# Patient Record
Sex: Female | Born: 1950 | Race: White | Hispanic: No | Marital: Married | Smoking: Never smoker
Health system: Southern US, Community
[De-identification: ages and names within clinical notes are randomized; demographics above are authoritative.]

## PROBLEM LIST (undated history)

## (undated) DIAGNOSIS — F411 Generalized anxiety disorder: Secondary | ICD-10-CM

## (undated) DIAGNOSIS — F419 Anxiety disorder, unspecified: Secondary | ICD-10-CM

## (undated) DIAGNOSIS — E785 Hyperlipidemia, unspecified: Secondary | ICD-10-CM

## (undated) DIAGNOSIS — J45909 Unspecified asthma, uncomplicated: Secondary | ICD-10-CM

## (undated) DIAGNOSIS — I10 Essential (primary) hypertension: Secondary | ICD-10-CM

## (undated) DIAGNOSIS — H409 Unspecified glaucoma: Secondary | ICD-10-CM

## (undated) DIAGNOSIS — T7840XA Allergy, unspecified, initial encounter: Secondary | ICD-10-CM

## (undated) DIAGNOSIS — R011 Cardiac murmur, unspecified: Secondary | ICD-10-CM

## (undated) DIAGNOSIS — I309 Acute pericarditis, unspecified: Secondary | ICD-10-CM

## (undated) DIAGNOSIS — I872 Venous insufficiency (chronic) (peripheral): Secondary | ICD-10-CM

## (undated) DIAGNOSIS — R079 Chest pain, unspecified: Secondary | ICD-10-CM

## (undated) DIAGNOSIS — N189 Chronic kidney disease, unspecified: Secondary | ICD-10-CM

## (undated) DIAGNOSIS — N951 Menopausal and female climacteric states: Secondary | ICD-10-CM

## (undated) DIAGNOSIS — G2581 Restless legs syndrome: Secondary | ICD-10-CM

## (undated) DIAGNOSIS — M1990 Unspecified osteoarthritis, unspecified site: Secondary | ICD-10-CM

## (undated) DIAGNOSIS — E876 Hypokalemia: Secondary | ICD-10-CM

## (undated) HISTORY — DX: Unspecified asthma, uncomplicated: J45.909

## (undated) HISTORY — DX: Restless legs syndrome: G25.81

## (undated) HISTORY — DX: Hypokalemia: E87.6

## (undated) HISTORY — DX: Unspecified osteoarthritis, unspecified site: M19.90

## (undated) HISTORY — DX: Essential (primary) hypertension: I10

## (undated) HISTORY — PX: CHEST TUBE INSERTION: SHX231

## (undated) HISTORY — DX: Cardiac murmur, unspecified: R01.1

## (undated) HISTORY — DX: Unspecified glaucoma: H40.9

## (undated) HISTORY — DX: Hyperlipidemia, unspecified: E78.5

## (undated) HISTORY — DX: Generalized anxiety disorder: F41.1

## (undated) HISTORY — DX: Venous insufficiency (chronic) (peripheral): I87.2

## (undated) HISTORY — DX: Anxiety disorder, unspecified: F41.9

## (undated) HISTORY — DX: Acute pericarditis, unspecified: I30.9

## (undated) HISTORY — DX: Chronic kidney disease, unspecified: N18.9

## (undated) HISTORY — DX: Chest pain, unspecified: R07.9

## (undated) HISTORY — PX: POLYPECTOMY: SHX149

## (undated) HISTORY — DX: Menopausal and female climacteric states: N95.1

## (undated) HISTORY — PX: VARICOSE VEIN SURGERY: SHX832

## (undated) HISTORY — DX: Allergy, unspecified, initial encounter: T78.40XA

---

## 1999-06-02 ENCOUNTER — Ambulatory Visit (HOSPITAL_BASED_OUTPATIENT_CLINIC_OR_DEPARTMENT_OTHER): Admission: RE | Admit: 1999-06-02 | Discharge: 1999-06-02 | Payer: Self-pay

## 2004-02-18 ENCOUNTER — Ambulatory Visit (HOSPITAL_COMMUNITY): Admission: RE | Admit: 2004-02-18 | Discharge: 2004-02-18 | Payer: Self-pay | Admitting: Internal Medicine

## 2004-02-18 ENCOUNTER — Ambulatory Visit: Payer: Self-pay | Admitting: Internal Medicine

## 2005-11-02 IMAGING — CT CT ANGIO CHEST
1 of 3 series · 18 of 31 positions shown · IV contrast (omnipaque)
Comparison: None.

CLINICAL DATA: Chest pain.  Evaluate for pulmonary embolus. 
 CHEST CT ANGIO WITH CONTRAST, 02/18/04:
TECHNIQUE: Multidetector CT imaging of the chest was performed according to the protocol for detection of pulmonary embolism during IV bolus injection of 150 cc Omnipaque 300.  Coronal and sagittal plane reformatted images were also generated.  
 There is no evidence for filling defect within the opacified pulmonary arterial vasculature to suggest the presence of an acute pulmonary embolus.  
 No lymphadenopathy is identified in the chest.  A tiny focus of soft tissue attenuation in the anterior mediastinum is of indeterminate significance as this patient is older than would be expected for persistent thymic remnant.  The patient is incidentally noted to have an anomalous pulmonary vein draining the posterior superior segment of the right lower lobe.  
 The heart is mildly enlarged and there is a small pericardial effusion.  
 Lung windows show a focal area of pleural thickening in the posterior right hemithorax (image 24).  Soft tissue attenuation at the apices is mildly asymmetric and probably related to scarring although neoplasm can have this appearance. 
 Incidental imaging of the upper abdomen demonstrates a focus of low attenuation at the gallbladder fossa.

[Series 4: chest/pe 1.0 b10f · axial · 0.57mm/px · z∈[-324,-92]mm · 18 of 512 slices shown]
[im 24/512  lung]
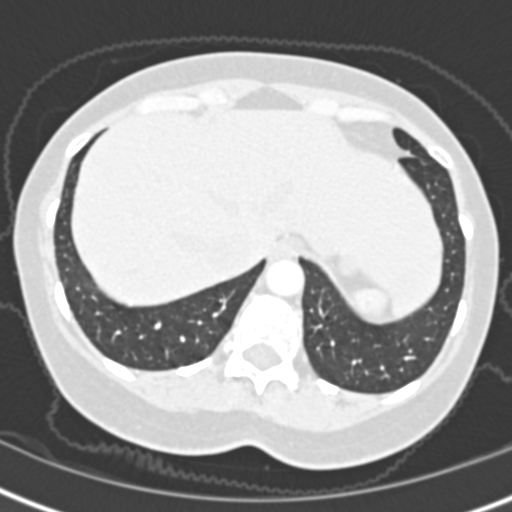
[im 47/512  mediastinal]
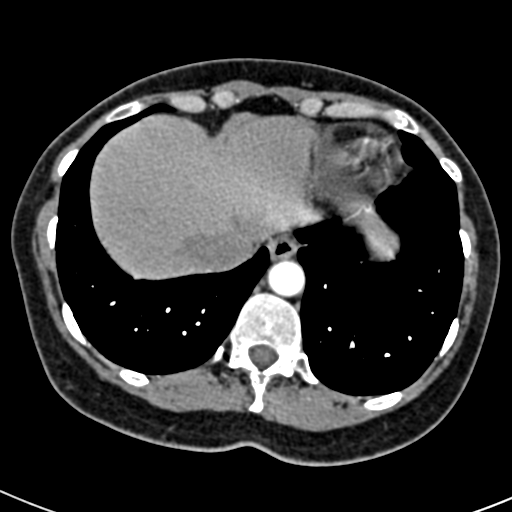
[im 93/512  lung]
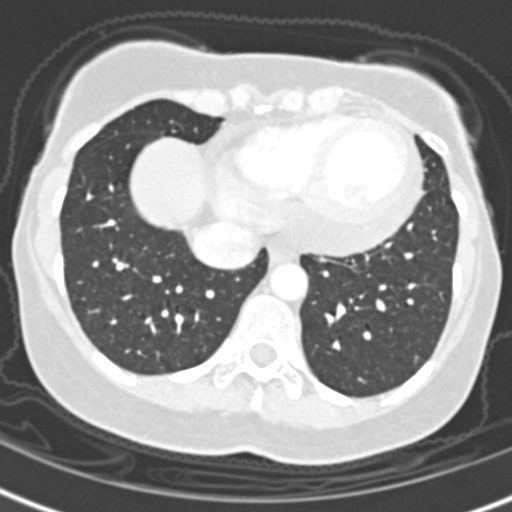
[im 117/512  mediastinal]
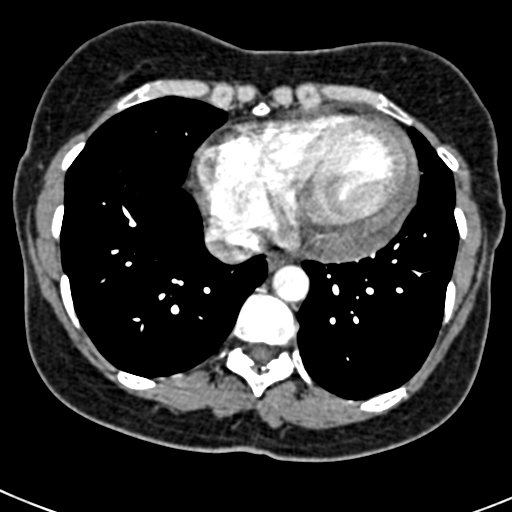
[im 140/512  lung]
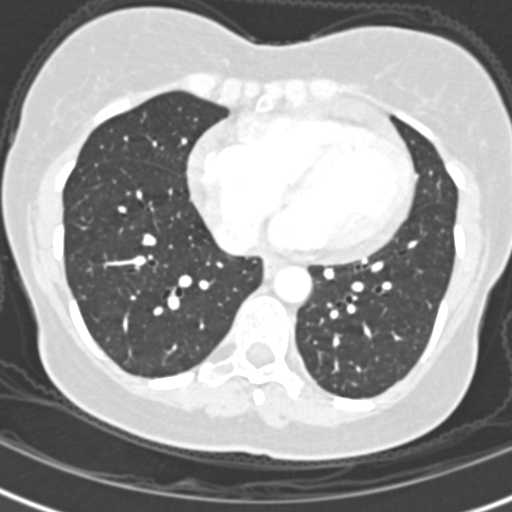
[im 171/512  mediastinal]
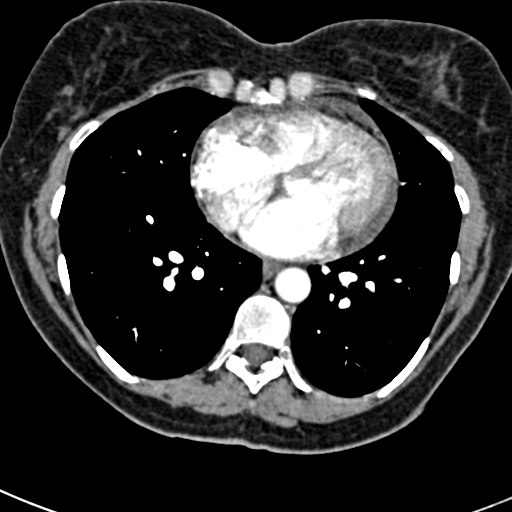
[im 186/512  lung]
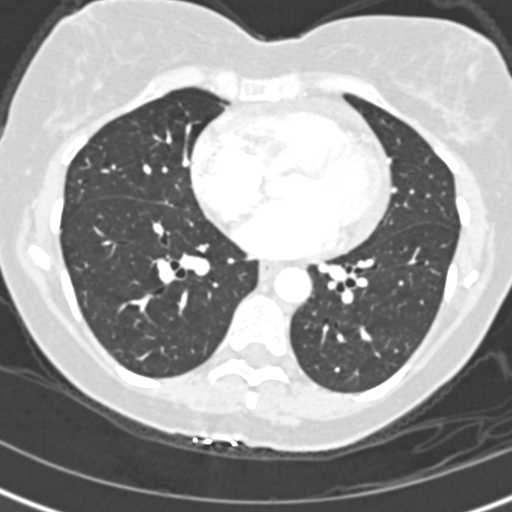
[im 210/512  mediastinal]
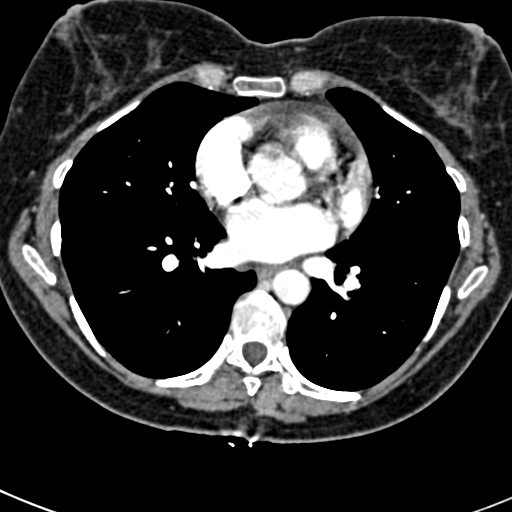
[im 233/512  lung]
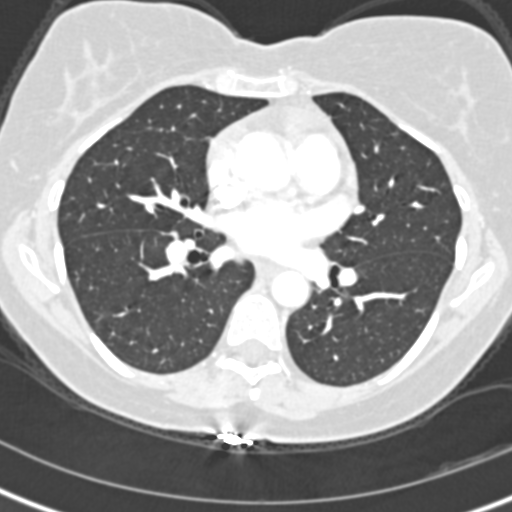
[im 279/512  mediastinal]
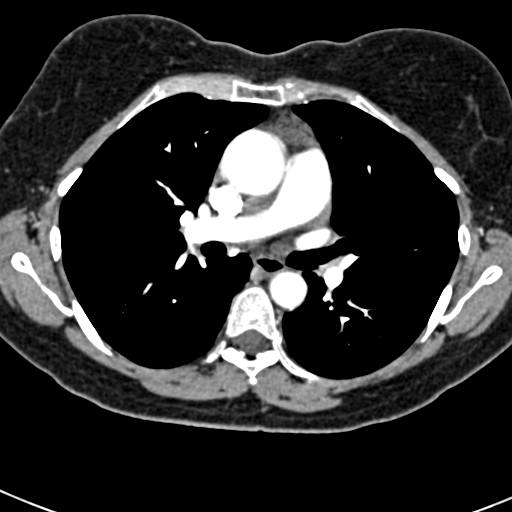
[im 302/512  lung]
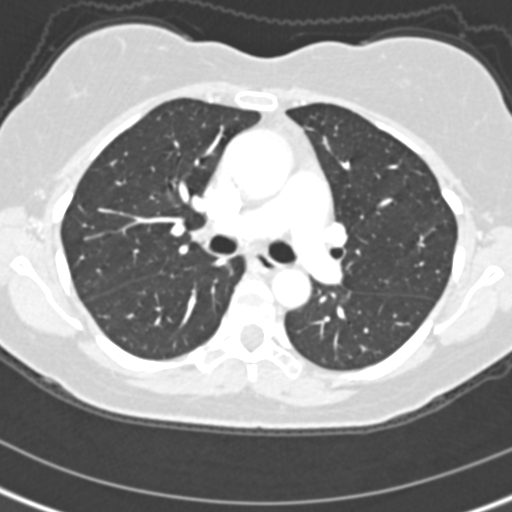
[im 326/512  mediastinal]
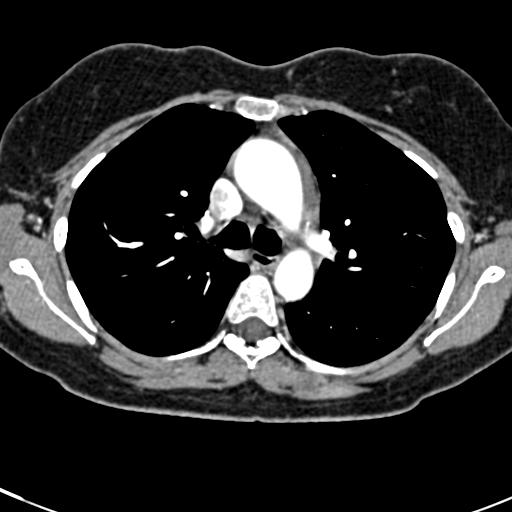
[im 341/512  lung]
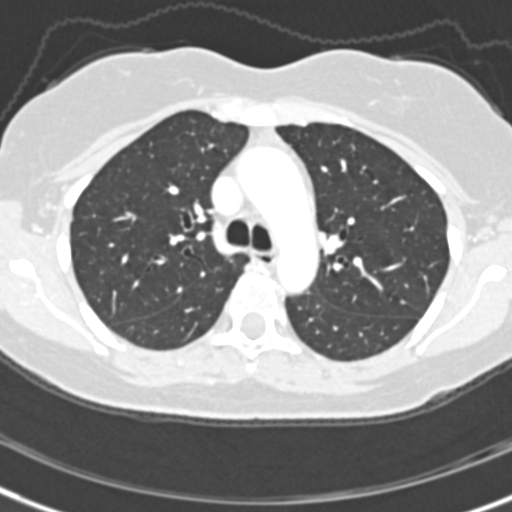
[im 372/512  mediastinal]
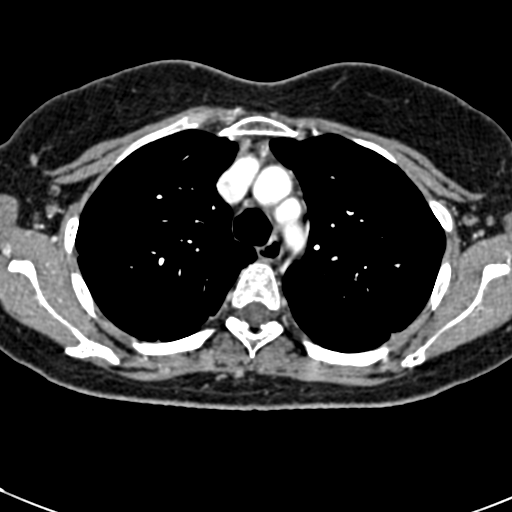
[im 395/512  lung]
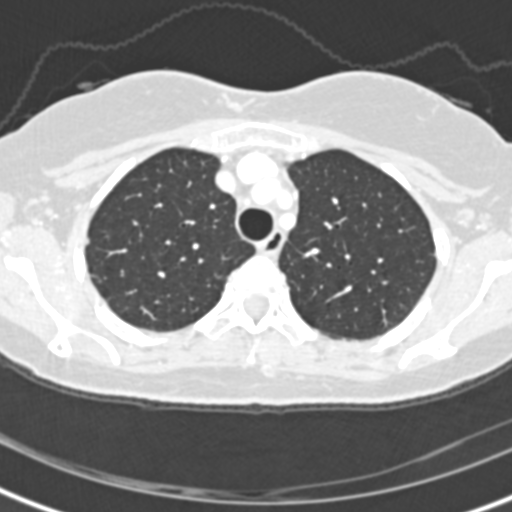
[im 419/512  mediastinal]
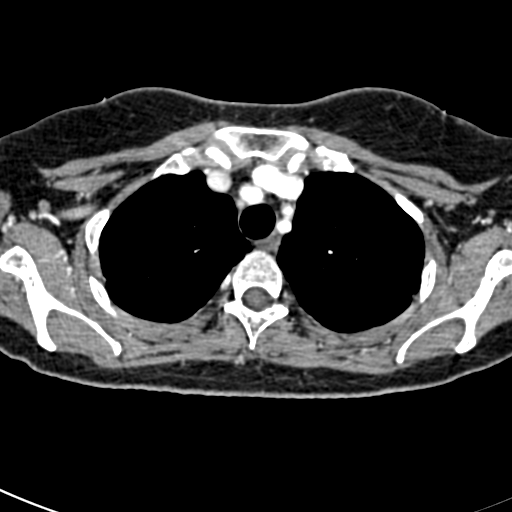
[im 465/512  lung]
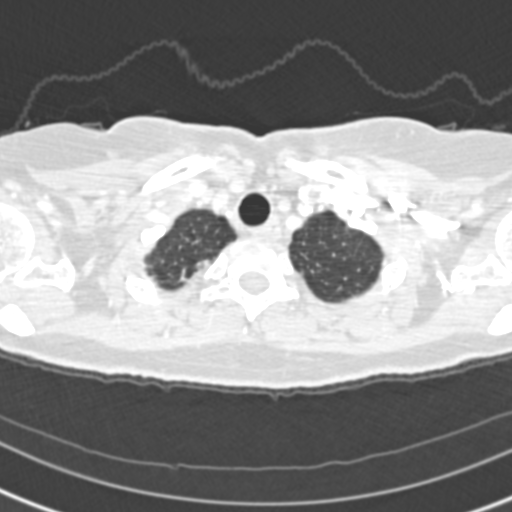
[im 488/512  mediastinal]
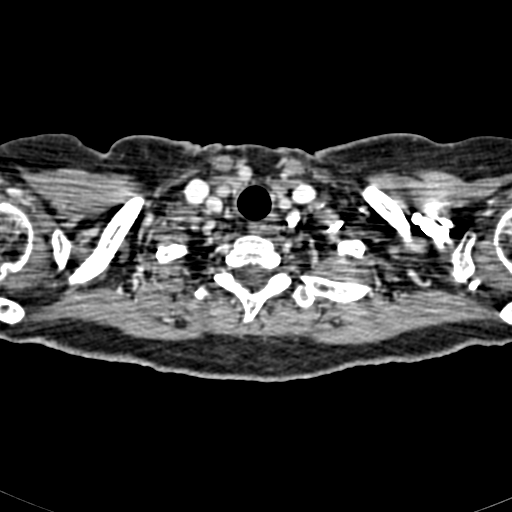

[18 of 31 positions shown; findings below may reference images not displayed]

IMPRESSION: 1. No spiral CT evidence for acute pulmonary embolus.  
 2. Asymmetric soft tissue density in the lung apices associated with a small focus of posteromedial pleural thickening on the right.  These features could be assessed for stability by a follow-up uninfused CT scan of the chest in 3 months.  The small focus of soft tissue attenuation in the anterior mediastinum could be reassessed at that time as well.  This follow-up CT scan could be performed without IV contrast.  
 3. Small pericardial effusion.
 4. Focus of low attenuation at the gallbladder fossa has been incompletely visualized.  This is probably related to focal fatty infiltration but dedicated liver CT or MRI would be helpful to further characterize.

## 2013-12-26 HISTORY — PX: COLONOSCOPY: SHX174

## 2015-11-07 DIAGNOSIS — M8580 Other specified disorders of bone density and structure, unspecified site: Secondary | ICD-10-CM

## 2015-11-07 DIAGNOSIS — N951 Menopausal and female climacteric states: Secondary | ICD-10-CM

## 2015-11-07 DIAGNOSIS — I10 Essential (primary) hypertension: Secondary | ICD-10-CM

## 2015-11-07 HISTORY — DX: Menopausal and female climacteric states: N95.1

## 2015-11-07 HISTORY — DX: Other specified disorders of bone density and structure, unspecified site: M85.80

## 2015-11-07 HISTORY — DX: Essential (primary) hypertension: I10

## 2018-10-23 DIAGNOSIS — S90862A Insect bite (nonvenomous), left foot, initial encounter: Secondary | ICD-10-CM | POA: Diagnosis not present

## 2018-10-23 DIAGNOSIS — L089 Local infection of the skin and subcutaneous tissue, unspecified: Secondary | ICD-10-CM | POA: Diagnosis not present

## 2018-10-23 DIAGNOSIS — W57XXXA Bitten or stung by nonvenomous insect and other nonvenomous arthropods, initial encounter: Secondary | ICD-10-CM | POA: Diagnosis not present

## 2018-10-23 DIAGNOSIS — E876 Hypokalemia: Secondary | ICD-10-CM | POA: Diagnosis not present

## 2018-11-14 DIAGNOSIS — I10 Essential (primary) hypertension: Secondary | ICD-10-CM | POA: Diagnosis not present

## 2018-11-14 DIAGNOSIS — G2581 Restless legs syndrome: Secondary | ICD-10-CM | POA: Diagnosis not present

## 2018-11-14 DIAGNOSIS — R7301 Impaired fasting glucose: Secondary | ICD-10-CM | POA: Diagnosis not present

## 2018-11-14 DIAGNOSIS — E785 Hyperlipidemia, unspecified: Secondary | ICD-10-CM | POA: Diagnosis not present

## 2018-11-14 DIAGNOSIS — E876 Hypokalemia: Secondary | ICD-10-CM | POA: Diagnosis not present

## 2018-11-14 DIAGNOSIS — F411 Generalized anxiety disorder: Secondary | ICD-10-CM | POA: Diagnosis not present

## 2018-11-14 DIAGNOSIS — M8589 Other specified disorders of bone density and structure, multiple sites: Secondary | ICD-10-CM | POA: Diagnosis not present

## 2018-11-20 DIAGNOSIS — R350 Frequency of micturition: Secondary | ICD-10-CM | POA: Diagnosis not present

## 2018-11-20 DIAGNOSIS — N9419 Other specified dyspareunia: Secondary | ICD-10-CM | POA: Diagnosis not present

## 2018-11-20 DIAGNOSIS — N951 Menopausal and female climacteric states: Secondary | ICD-10-CM | POA: Diagnosis not present

## 2018-11-20 DIAGNOSIS — R3915 Urgency of urination: Secondary | ICD-10-CM | POA: Diagnosis not present

## 2018-12-13 DIAGNOSIS — N952 Postmenopausal atrophic vaginitis: Secondary | ICD-10-CM | POA: Diagnosis not present

## 2019-01-04 DIAGNOSIS — D485 Neoplasm of uncertain behavior of skin: Secondary | ICD-10-CM | POA: Diagnosis not present

## 2019-01-04 DIAGNOSIS — L570 Actinic keratosis: Secondary | ICD-10-CM | POA: Diagnosis not present

## 2019-01-04 DIAGNOSIS — L578 Other skin changes due to chronic exposure to nonionizing radiation: Secondary | ICD-10-CM | POA: Diagnosis not present

## 2019-01-04 DIAGNOSIS — L918 Other hypertrophic disorders of the skin: Secondary | ICD-10-CM | POA: Diagnosis not present

## 2019-02-12 DIAGNOSIS — H2513 Age-related nuclear cataract, bilateral: Secondary | ICD-10-CM | POA: Diagnosis not present

## 2019-03-09 ENCOUNTER — Encounter: Payer: Self-pay | Admitting: Gastroenterology

## 2019-04-25 ENCOUNTER — Encounter: Payer: Self-pay | Admitting: Gastroenterology

## 2019-05-16 ENCOUNTER — Other Ambulatory Visit: Payer: Self-pay

## 2019-05-16 ENCOUNTER — Ambulatory Visit (AMBULATORY_SURGERY_CENTER): Payer: PPO

## 2019-05-16 VITALS — Temp 97.3°F | Ht 66.0 in | Wt 198.0 lb

## 2019-05-16 DIAGNOSIS — Z800 Family history of malignant neoplasm of digestive organs: Secondary | ICD-10-CM

## 2019-05-16 NOTE — Progress Notes (Signed)

## 2019-05-25 DIAGNOSIS — Z0000 Encounter for general adult medical examination without abnormal findings: Secondary | ICD-10-CM | POA: Diagnosis not present

## 2019-05-25 DIAGNOSIS — G2581 Restless legs syndrome: Secondary | ICD-10-CM | POA: Diagnosis not present

## 2019-05-25 DIAGNOSIS — Z9181 History of falling: Secondary | ICD-10-CM | POA: Diagnosis not present

## 2019-05-25 DIAGNOSIS — F411 Generalized anxiety disorder: Secondary | ICD-10-CM | POA: Diagnosis not present

## 2019-05-25 DIAGNOSIS — R7301 Impaired fasting glucose: Secondary | ICD-10-CM | POA: Diagnosis not present

## 2019-05-25 DIAGNOSIS — Z139 Encounter for screening, unspecified: Secondary | ICD-10-CM | POA: Diagnosis not present

## 2019-05-25 DIAGNOSIS — M8589 Other specified disorders of bone density and structure, multiple sites: Secondary | ICD-10-CM | POA: Diagnosis not present

## 2019-05-25 DIAGNOSIS — E876 Hypokalemia: Secondary | ICD-10-CM | POA: Diagnosis not present

## 2019-05-25 DIAGNOSIS — E785 Hyperlipidemia, unspecified: Secondary | ICD-10-CM | POA: Diagnosis not present

## 2019-05-25 DIAGNOSIS — I10 Essential (primary) hypertension: Secondary | ICD-10-CM | POA: Diagnosis not present

## 2019-05-25 DIAGNOSIS — Z6831 Body mass index (BMI) 31.0-31.9, adult: Secondary | ICD-10-CM | POA: Diagnosis not present

## 2019-05-25 DIAGNOSIS — Z1331 Encounter for screening for depression: Secondary | ICD-10-CM | POA: Diagnosis not present

## 2019-05-28 ENCOUNTER — Encounter: Payer: Self-pay | Admitting: Gastroenterology

## 2019-06-01 ENCOUNTER — Encounter: Payer: Self-pay | Admitting: Gastroenterology

## 2019-06-01 ENCOUNTER — Other Ambulatory Visit: Payer: Self-pay

## 2019-06-01 ENCOUNTER — Ambulatory Visit (AMBULATORY_SURGERY_CENTER): Payer: PPO | Admitting: Gastroenterology

## 2019-06-01 VITALS — BP 107/60 | HR 60 | Temp 97.5°F | Resp 12 | Ht 66.0 in | Wt 198.0 lb

## 2019-06-01 DIAGNOSIS — Z800 Family history of malignant neoplasm of digestive organs: Secondary | ICD-10-CM | POA: Diagnosis not present

## 2019-06-01 DIAGNOSIS — Z1211 Encounter for screening for malignant neoplasm of colon: Secondary | ICD-10-CM

## 2019-06-01 DIAGNOSIS — G2581 Restless legs syndrome: Secondary | ICD-10-CM | POA: Diagnosis not present

## 2019-06-01 DIAGNOSIS — I10 Essential (primary) hypertension: Secondary | ICD-10-CM | POA: Diagnosis not present

## 2019-06-01 MED ORDER — SODIUM CHLORIDE 0.9 % IV SOLN: 500.0000 mL | Freq: Once | INTRAVENOUS | Status: DC

## 2019-06-01 NOTE — Progress Notes (Signed)
Temp by JB. VS by DT. 

## 2019-06-01 NOTE — Progress Notes (Signed)
Report to PACU, RN, vss, BBS= Clear.  

## 2019-06-01 NOTE — Patient Instructions (Signed)
YOU HAD AN ENDOSCOPIC PROCEDURE TODAY AT THE [facility]:   Refer to the procedure report that was given to you for any specific questions about what was found during the examination.  If the procedure report does not answer your questions, please call your gastroenterologist to clarify.  If you requested that your care partner not be given the details of your procedure findings, then the procedure report has been included in a sealed envelope for you to review at your convenience later.  YOU SHOULD EXPECT: Some feelings of bloating in the abdomen. Passage of more gas than usual.  Walking can help get rid of the air that was put into your GI tract during the procedure and reduce the bloating. If you had a lower endoscopy (such as a colonoscopy or flexible sigmoidoscopy) you may notice spotting of blood in your stool or on the toilet paper. If you underwent a bowel prep for your procedure, you may not have a normal bowel movement for a few days.  Please Note:  You might notice some irritation and congestion in your nose or some drainage.  This is from the oxygen used during your procedure.  There is no need for concern and it should clear up in a day or so.  SYMPTOMS TO REPORT IMMEDIATELY:   Following lower endoscopy (colonoscopy or flexible sigmoidoscopy):  Excessive amounts of blood in the stool  Significant tenderness or worsening of abdominal pains  Swelling of the abdomen that is new, acute  Fever of 100F or higher  For urgent or emergent issues, a gastroenterologist can be reached at any hour by calling [phone]. Do not use MyChart messaging for urgent concerns.    DIET:  We do recommend a small meal at first, but then you may proceed to your regular diet.  Drink plenty of fluids but you should avoid alcoholic beverages for 24 hours.  ACTIVITY:  You should plan to take it easy for the rest of today and you should NOT DRIVE or use heavy machinery until tomorrow (because  of the sedation medicines used during the test).    FOLLOW UP: Our staff will call the number listed on your records 48-72 hours following your procedure to check on you and address any questions or concerns that you may have regarding the information given to you following your procedure. If we do not reach you, we will leave a message.  We will attempt to reach you two times.  During this call, we will ask if you have developed any symptoms of COVID 19. If you develop any symptoms (ie: fever, flu-like symptoms, shortness of breath, cough etc.) before then, please call [phone].  If you test positive for Covid 19 in the 2 weeks post procedure, please call and report this information to us.    If any biopsies were taken you will be contacted by phone or by letter within the next 1-3 weeks.  Please call us at [phone] if you have not heard about the biopsies in 3 weeks.    SIGNATURES/CONFIDENTIALITY: You and/or your care partner have signed paperwork which will be entered into your electronic medical record.  These signatures attest to the fact that that the information above on your After Visit Summary has been reviewed and is understood.  Full responsibility of the confidentiality of this discharge information lies with you and/or your care-partner. 

## 2019-06-01 NOTE — Op Note (Signed)
[facility] Patient Name: Susan Finley Procedure Date: 06/01/2019 8:41 AM MRN: [id number] Endoscopist: Jackquline Denmark , MD Age: 69 Referring MD:  Date of Birth: [birth year] Gender: Female Account #: [id number] Procedure:                Colonoscopy Indications:              Screening in patient at increased risk: Family                            history of 1st-degree relative with colorectal                            cancer (mom) Medicines:                Monitored Anesthesia Care Procedure:                Pre-Anesthesia Assessment:                           - Prior to the procedure, a History and Physical                            was performed, and patient medications and                            allergies were reviewed. The patient's tolerance of                            previous anesthesia was also reviewed. The risks                            and benefits of the procedure and the sedation                            options and risks were discussed with the patient.                            All questions were answered, and informed consent                            was obtained. Prior Anticoagulants: The patient has                            taken no previous anticoagulant or antiplatelet                            agents. ASA Grade Assessment: II - A patient with                            mild systemic disease. After reviewing the risks                            and benefits, the patient was deemed in  satisfactory condition to undergo the procedure.                           After obtaining informed consent, the colonoscope                            was passed under direct vision. Throughout the                            procedure, the patient's blood pressure, pulse, and                            oxygen saturations were monitored continuously. The                            Colonoscope was introduced through the anus and                       advanced to the 2 cm into the ileum. The                            colonoscopy was performed without difficulty. The                            patient tolerated the procedure well. The quality                            of the bowel preparation was adequate to identify                            polyps. The ileocecal valve, appendiceal orifice,                            and rectum were photographed. Scope In: 8:49:13 AM Scope Out: 9:03:52 AM Scope Withdrawal Time: 0 hours 10 minutes 5 seconds  Total Procedure Duration: 0 hours 14 minutes 39 seconds  Findings:                 A few small-mouthed diverticula were found in the                            sigmoid colon.                           Non-bleeding internal hemorrhoids were found during                            retroflexion. The hemorrhoids were small.                           The terminal ileum appeared normal.                           The exam was otherwise without abnormality on  direct and retroflexion views. Complications:            No immediate complications. Estimated Blood Loss:     Estimated blood loss: none. Impression:               -Mild sigmoid diverticulosis                           -Non-bleeding internal hemorrhoids.                           -Otherwise normal colonoscopy to TI. Recommendation:           - Patient has a contact number available for                            emergencies. The signs and symptoms of potential                            delayed complications were discussed with the                            patient. Return to normal activities tomorrow.                            Written discharge instructions were provided to the                            patient.                           - Resume previous diet.                           - Continue present medications.                           - Repeat colonoscopy in 5 years for screening                             purposes. Earlier, if with any new problems or if                            there is any change in family history.                           - Return to GI clinic PRN.                           - D/W Doreene Nest, MD 06/01/2019 9:09:34 AM This report has been signed electronically.

## 2019-06-05 ENCOUNTER — Telehealth: Payer: Self-pay

## 2019-06-05 ENCOUNTER — Telehealth: Payer: Self-pay | Admitting: *Deleted

## 2019-06-05 NOTE — Telephone Encounter (Signed)
No answer for post procedure call back. Left message for patient to call with questions or concerns. 

## 2019-06-05 NOTE — Telephone Encounter (Signed)
No answer, left message to call back later today, B.Bilal Manzer RN. 

## 2019-10-03 DIAGNOSIS — L821 Other seborrheic keratosis: Secondary | ICD-10-CM | POA: Diagnosis not present

## 2019-10-03 DIAGNOSIS — L570 Actinic keratosis: Secondary | ICD-10-CM | POA: Diagnosis not present

## 2019-10-03 DIAGNOSIS — L820 Inflamed seborrheic keratosis: Secondary | ICD-10-CM | POA: Diagnosis not present

## 2019-10-17 DIAGNOSIS — Z780 Asymptomatic menopausal state: Secondary | ICD-10-CM | POA: Diagnosis not present

## 2019-10-17 DIAGNOSIS — M85852 Other specified disorders of bone density and structure, left thigh: Secondary | ICD-10-CM | POA: Diagnosis not present

## 2019-10-17 DIAGNOSIS — M85851 Other specified disorders of bone density and structure, right thigh: Secondary | ICD-10-CM | POA: Diagnosis not present

## 2019-10-17 DIAGNOSIS — Z1231 Encounter for screening mammogram for malignant neoplasm of breast: Secondary | ICD-10-CM | POA: Diagnosis not present

## 2019-11-15 DIAGNOSIS — L578 Other skin changes due to chronic exposure to nonionizing radiation: Secondary | ICD-10-CM | POA: Diagnosis not present

## 2019-11-15 DIAGNOSIS — L570 Actinic keratosis: Secondary | ICD-10-CM | POA: Diagnosis not present

## 2019-11-15 DIAGNOSIS — D485 Neoplasm of uncertain behavior of skin: Secondary | ICD-10-CM | POA: Diagnosis not present

## 2019-11-15 DIAGNOSIS — L820 Inflamed seborrheic keratosis: Secondary | ICD-10-CM | POA: Diagnosis not present

## 2019-11-15 DIAGNOSIS — D225 Melanocytic nevi of trunk: Secondary | ICD-10-CM | POA: Diagnosis not present

## 2019-11-15 DIAGNOSIS — L821 Other seborrheic keratosis: Secondary | ICD-10-CM | POA: Diagnosis not present

## 2019-11-15 DIAGNOSIS — L814 Other melanin hyperpigmentation: Secondary | ICD-10-CM | POA: Diagnosis not present

## 2019-11-15 DIAGNOSIS — D2271 Melanocytic nevi of right lower limb, including hip: Secondary | ICD-10-CM | POA: Diagnosis not present

## 2019-11-19 DIAGNOSIS — R6889 Other general symptoms and signs: Secondary | ICD-10-CM | POA: Diagnosis not present

## 2019-11-22 DIAGNOSIS — I10 Essential (primary) hypertension: Secondary | ICD-10-CM | POA: Diagnosis not present

## 2019-11-22 DIAGNOSIS — R7301 Impaired fasting glucose: Secondary | ICD-10-CM | POA: Diagnosis not present

## 2019-11-22 DIAGNOSIS — E876 Hypokalemia: Secondary | ICD-10-CM | POA: Diagnosis not present

## 2019-11-22 DIAGNOSIS — G2581 Restless legs syndrome: Secondary | ICD-10-CM | POA: Diagnosis not present

## 2019-11-22 DIAGNOSIS — F411 Generalized anxiety disorder: Secondary | ICD-10-CM | POA: Diagnosis not present

## 2019-11-22 DIAGNOSIS — M8589 Other specified disorders of bone density and structure, multiple sites: Secondary | ICD-10-CM | POA: Diagnosis not present

## 2019-11-22 DIAGNOSIS — E785 Hyperlipidemia, unspecified: Secondary | ICD-10-CM | POA: Diagnosis not present

## 2020-01-02 DIAGNOSIS — I8312 Varicose veins of left lower extremity with inflammation: Secondary | ICD-10-CM | POA: Diagnosis not present

## 2020-01-02 DIAGNOSIS — G2581 Restless legs syndrome: Secondary | ICD-10-CM | POA: Diagnosis not present

## 2020-01-02 DIAGNOSIS — I8311 Varicose veins of right lower extremity with inflammation: Secondary | ICD-10-CM | POA: Diagnosis not present

## 2020-01-02 DIAGNOSIS — I83813 Varicose veins of bilateral lower extremities with pain: Secondary | ICD-10-CM | POA: Diagnosis not present

## 2020-01-24 DIAGNOSIS — I83813 Varicose veins of bilateral lower extremities with pain: Secondary | ICD-10-CM | POA: Diagnosis not present

## 2020-01-30 DIAGNOSIS — I8312 Varicose veins of left lower extremity with inflammation: Secondary | ICD-10-CM | POA: Diagnosis not present

## 2020-01-30 DIAGNOSIS — I8311 Varicose veins of right lower extremity with inflammation: Secondary | ICD-10-CM | POA: Diagnosis not present

## 2020-01-30 DIAGNOSIS — G2581 Restless legs syndrome: Secondary | ICD-10-CM | POA: Diagnosis not present

## 2020-01-30 DIAGNOSIS — I83813 Varicose veins of bilateral lower extremities with pain: Secondary | ICD-10-CM | POA: Diagnosis not present

## 2020-03-26 DIAGNOSIS — Z01419 Encounter for gynecological examination (general) (routine) without abnormal findings: Secondary | ICD-10-CM | POA: Diagnosis not present

## 2020-04-17 DIAGNOSIS — M7652 Patellar tendinitis, left knee: Secondary | ICD-10-CM | POA: Diagnosis not present

## 2020-04-17 DIAGNOSIS — M7651 Patellar tendinitis, right knee: Secondary | ICD-10-CM | POA: Diagnosis not present

## 2020-04-17 DIAGNOSIS — M170 Bilateral primary osteoarthritis of knee: Secondary | ICD-10-CM | POA: Diagnosis not present

## 2020-04-25 DIAGNOSIS — I83811 Varicose veins of right lower extremities with pain: Secondary | ICD-10-CM | POA: Diagnosis not present

## 2020-04-25 DIAGNOSIS — I8311 Varicose veins of right lower extremity with inflammation: Secondary | ICD-10-CM | POA: Diagnosis not present

## 2020-04-30 DIAGNOSIS — I8311 Varicose veins of right lower extremity with inflammation: Secondary | ICD-10-CM | POA: Diagnosis not present

## 2020-05-07 DIAGNOSIS — I8311 Varicose veins of right lower extremity with inflammation: Secondary | ICD-10-CM | POA: Diagnosis not present

## 2020-05-23 DIAGNOSIS — F411 Generalized anxiety disorder: Secondary | ICD-10-CM | POA: Diagnosis not present

## 2020-05-23 DIAGNOSIS — I10 Essential (primary) hypertension: Secondary | ICD-10-CM | POA: Diagnosis not present

## 2020-05-23 DIAGNOSIS — I8312 Varicose veins of left lower extremity with inflammation: Secondary | ICD-10-CM | POA: Diagnosis not present

## 2020-05-23 DIAGNOSIS — G2581 Restless legs syndrome: Secondary | ICD-10-CM | POA: Diagnosis not present

## 2020-05-23 DIAGNOSIS — E785 Hyperlipidemia, unspecified: Secondary | ICD-10-CM | POA: Diagnosis not present

## 2020-05-23 DIAGNOSIS — R7301 Impaired fasting glucose: Secondary | ICD-10-CM | POA: Diagnosis not present

## 2020-05-23 DIAGNOSIS — E876 Hypokalemia: Secondary | ICD-10-CM | POA: Diagnosis not present

## 2020-05-23 DIAGNOSIS — M8589 Other specified disorders of bone density and structure, multiple sites: Secondary | ICD-10-CM | POA: Diagnosis not present

## 2020-06-04 DIAGNOSIS — I8311 Varicose veins of right lower extremity with inflammation: Secondary | ICD-10-CM | POA: Diagnosis not present

## 2020-06-18 DIAGNOSIS — I83892 Varicose veins of left lower extremities with other complications: Secondary | ICD-10-CM | POA: Diagnosis not present

## 2020-06-18 DIAGNOSIS — I8312 Varicose veins of left lower extremity with inflammation: Secondary | ICD-10-CM | POA: Diagnosis not present

## 2020-07-09 DIAGNOSIS — I8311 Varicose veins of right lower extremity with inflammation: Secondary | ICD-10-CM | POA: Diagnosis not present

## 2020-07-09 DIAGNOSIS — M7981 Nontraumatic hematoma of soft tissue: Secondary | ICD-10-CM | POA: Diagnosis not present

## 2020-07-10 DIAGNOSIS — L299 Pruritus, unspecified: Secondary | ICD-10-CM | POA: Diagnosis not present

## 2020-07-10 DIAGNOSIS — L301 Dyshidrosis [pompholyx]: Secondary | ICD-10-CM | POA: Diagnosis not present

## 2020-07-30 DIAGNOSIS — M7981 Nontraumatic hematoma of soft tissue: Secondary | ICD-10-CM | POA: Diagnosis not present

## 2020-07-30 DIAGNOSIS — I8312 Varicose veins of left lower extremity with inflammation: Secondary | ICD-10-CM | POA: Diagnosis not present

## 2020-08-21 DIAGNOSIS — I83891 Varicose veins of right lower extremities with other complications: Secondary | ICD-10-CM | POA: Diagnosis not present

## 2020-08-21 DIAGNOSIS — I8311 Varicose veins of right lower extremity with inflammation: Secondary | ICD-10-CM | POA: Diagnosis not present

## 2020-09-11 DIAGNOSIS — I8312 Varicose veins of left lower extremity with inflammation: Secondary | ICD-10-CM | POA: Diagnosis not present

## 2020-09-18 DIAGNOSIS — M79672 Pain in left foot: Secondary | ICD-10-CM | POA: Diagnosis not present

## 2020-09-18 DIAGNOSIS — S99922A Unspecified injury of left foot, initial encounter: Secondary | ICD-10-CM | POA: Diagnosis not present

## 2020-09-30 DIAGNOSIS — M7989 Other specified soft tissue disorders: Secondary | ICD-10-CM | POA: Diagnosis not present

## 2020-09-30 DIAGNOSIS — M19072 Primary osteoarthritis, left ankle and foot: Secondary | ICD-10-CM | POA: Diagnosis not present

## 2020-09-30 DIAGNOSIS — M79672 Pain in left foot: Secondary | ICD-10-CM | POA: Diagnosis not present

## 2020-10-02 DIAGNOSIS — E785 Hyperlipidemia, unspecified: Secondary | ICD-10-CM | POA: Diagnosis not present

## 2020-10-02 DIAGNOSIS — Z139 Encounter for screening, unspecified: Secondary | ICD-10-CM | POA: Diagnosis not present

## 2020-10-02 DIAGNOSIS — Z1331 Encounter for screening for depression: Secondary | ICD-10-CM | POA: Diagnosis not present

## 2020-10-02 DIAGNOSIS — Z9181 History of falling: Secondary | ICD-10-CM | POA: Diagnosis not present

## 2020-10-02 DIAGNOSIS — E669 Obesity, unspecified: Secondary | ICD-10-CM | POA: Diagnosis not present

## 2020-10-02 DIAGNOSIS — Z0000 Encounter for general adult medical examination without abnormal findings: Secondary | ICD-10-CM | POA: Diagnosis not present

## 2020-10-10 DIAGNOSIS — M7981 Nontraumatic hematoma of soft tissue: Secondary | ICD-10-CM | POA: Diagnosis not present

## 2020-10-10 DIAGNOSIS — I8312 Varicose veins of left lower extremity with inflammation: Secondary | ICD-10-CM | POA: Diagnosis not present

## 2020-10-22 DIAGNOSIS — Z1231 Encounter for screening mammogram for malignant neoplasm of breast: Secondary | ICD-10-CM | POA: Diagnosis not present

## 2020-11-14 DIAGNOSIS — I8312 Varicose veins of left lower extremity with inflammation: Secondary | ICD-10-CM | POA: Diagnosis not present

## 2020-11-14 DIAGNOSIS — I83812 Varicose veins of left lower extremities with pain: Secondary | ICD-10-CM | POA: Diagnosis not present

## 2020-11-14 DIAGNOSIS — I83892 Varicose veins of left lower extremities with other complications: Secondary | ICD-10-CM | POA: Diagnosis not present

## 2020-11-21 DIAGNOSIS — M8589 Other specified disorders of bone density and structure, multiple sites: Secondary | ICD-10-CM | POA: Diagnosis not present

## 2020-11-21 DIAGNOSIS — G2581 Restless legs syndrome: Secondary | ICD-10-CM | POA: Diagnosis not present

## 2020-11-21 DIAGNOSIS — E785 Hyperlipidemia, unspecified: Secondary | ICD-10-CM | POA: Diagnosis not present

## 2020-11-21 DIAGNOSIS — E876 Hypokalemia: Secondary | ICD-10-CM | POA: Diagnosis not present

## 2020-11-21 DIAGNOSIS — R7301 Impaired fasting glucose: Secondary | ICD-10-CM | POA: Diagnosis not present

## 2020-11-21 DIAGNOSIS — F411 Generalized anxiety disorder: Secondary | ICD-10-CM | POA: Diagnosis not present

## 2020-11-21 DIAGNOSIS — I10 Essential (primary) hypertension: Secondary | ICD-10-CM | POA: Diagnosis not present

## 2020-12-17 DIAGNOSIS — H2513 Age-related nuclear cataract, bilateral: Secondary | ICD-10-CM | POA: Diagnosis not present

## 2020-12-22 DIAGNOSIS — J302 Other seasonal allergic rhinitis: Secondary | ICD-10-CM | POA: Diagnosis not present

## 2020-12-22 DIAGNOSIS — F419 Anxiety disorder, unspecified: Secondary | ICD-10-CM | POA: Diagnosis not present

## 2020-12-22 DIAGNOSIS — I493 Ventricular premature depolarization: Secondary | ICD-10-CM | POA: Diagnosis not present

## 2020-12-22 DIAGNOSIS — I249 Acute ischemic heart disease, unspecified: Secondary | ICD-10-CM | POA: Diagnosis not present

## 2020-12-22 DIAGNOSIS — I313 Pericardial effusion (noninflammatory): Secondary | ICD-10-CM | POA: Diagnosis not present

## 2020-12-22 DIAGNOSIS — E7800 Pure hypercholesterolemia, unspecified: Secondary | ICD-10-CM | POA: Diagnosis not present

## 2020-12-22 DIAGNOSIS — E876 Hypokalemia: Secondary | ICD-10-CM | POA: Diagnosis not present

## 2020-12-22 DIAGNOSIS — R531 Weakness: Secondary | ICD-10-CM | POA: Diagnosis not present

## 2020-12-22 DIAGNOSIS — I160 Hypertensive urgency: Secondary | ICD-10-CM | POA: Diagnosis not present

## 2020-12-22 DIAGNOSIS — I10 Essential (primary) hypertension: Secondary | ICD-10-CM | POA: Diagnosis not present

## 2020-12-22 DIAGNOSIS — M1990 Unspecified osteoarthritis, unspecified site: Secondary | ICD-10-CM | POA: Diagnosis not present

## 2020-12-23 DIAGNOSIS — I517 Cardiomegaly: Secondary | ICD-10-CM | POA: Diagnosis not present

## 2020-12-23 DIAGNOSIS — I313 Pericardial effusion (noninflammatory): Secondary | ICD-10-CM | POA: Diagnosis not present

## 2020-12-23 DIAGNOSIS — J9811 Atelectasis: Secondary | ICD-10-CM | POA: Diagnosis not present

## 2020-12-23 DIAGNOSIS — I10 Essential (primary) hypertension: Secondary | ICD-10-CM | POA: Diagnosis not present

## 2020-12-23 DIAGNOSIS — R531 Weakness: Secondary | ICD-10-CM | POA: Diagnosis not present

## 2020-12-23 DIAGNOSIS — R0602 Shortness of breath: Secondary | ICD-10-CM | POA: Diagnosis not present

## 2020-12-23 DIAGNOSIS — I493 Ventricular premature depolarization: Secondary | ICD-10-CM | POA: Diagnosis not present

## 2020-12-23 DIAGNOSIS — I160 Hypertensive urgency: Secondary | ICD-10-CM | POA: Diagnosis not present

## 2020-12-23 DIAGNOSIS — M1990 Unspecified osteoarthritis, unspecified site: Secondary | ICD-10-CM | POA: Diagnosis not present

## 2020-12-23 DIAGNOSIS — F419 Anxiety disorder, unspecified: Secondary | ICD-10-CM | POA: Diagnosis not present

## 2020-12-23 DIAGNOSIS — R5383 Other fatigue: Secondary | ICD-10-CM | POA: Diagnosis not present

## 2020-12-23 DIAGNOSIS — I249 Acute ischemic heart disease, unspecified: Secondary | ICD-10-CM | POA: Diagnosis not present

## 2020-12-23 DIAGNOSIS — R079 Chest pain, unspecified: Secondary | ICD-10-CM | POA: Diagnosis not present

## 2020-12-23 DIAGNOSIS — J302 Other seasonal allergic rhinitis: Secondary | ICD-10-CM | POA: Diagnosis not present

## 2020-12-23 DIAGNOSIS — E876 Hypokalemia: Secondary | ICD-10-CM | POA: Diagnosis not present

## 2020-12-23 DIAGNOSIS — E7800 Pure hypercholesterolemia, unspecified: Secondary | ICD-10-CM | POA: Diagnosis not present

## 2020-12-23 DIAGNOSIS — K449 Diaphragmatic hernia without obstruction or gangrene: Secondary | ICD-10-CM | POA: Diagnosis not present

## 2020-12-26 ENCOUNTER — Other Ambulatory Visit: Payer: Self-pay | Admitting: Cardiology

## 2020-12-27 DIAGNOSIS — Z0100 Encounter for examination of eyes and vision without abnormal findings: Secondary | ICD-10-CM | POA: Diagnosis not present

## 2020-12-31 DIAGNOSIS — I10 Essential (primary) hypertension: Secondary | ICD-10-CM | POA: Diagnosis not present

## 2020-12-31 DIAGNOSIS — I309 Acute pericarditis, unspecified: Secondary | ICD-10-CM | POA: Diagnosis not present

## 2020-12-31 DIAGNOSIS — E876 Hypokalemia: Secondary | ICD-10-CM | POA: Diagnosis not present

## 2020-12-31 DIAGNOSIS — R079 Chest pain, unspecified: Secondary | ICD-10-CM | POA: Diagnosis not present

## 2020-12-31 DIAGNOSIS — E785 Hyperlipidemia, unspecified: Secondary | ICD-10-CM | POA: Diagnosis not present

## 2020-12-31 DIAGNOSIS — M25561 Pain in right knee: Secondary | ICD-10-CM | POA: Diagnosis not present

## 2021-01-06 ENCOUNTER — Ambulatory Visit: Payer: Medicare HMO | Admitting: Cardiology

## 2021-01-06 ENCOUNTER — Other Ambulatory Visit: Payer: Self-pay

## 2021-01-06 VITALS — BP 140/80 | HR 61 | Ht 66.6 in | Wt 204.2 lb

## 2021-01-06 DIAGNOSIS — E782 Mixed hyperlipidemia: Secondary | ICD-10-CM

## 2021-01-06 DIAGNOSIS — I309 Acute pericarditis, unspecified: Secondary | ICD-10-CM | POA: Insufficient documentation

## 2021-01-06 DIAGNOSIS — I10 Essential (primary) hypertension: Secondary | ICD-10-CM | POA: Insufficient documentation

## 2021-01-06 DIAGNOSIS — M1990 Unspecified osteoarthritis, unspecified site: Secondary | ICD-10-CM | POA: Insufficient documentation

## 2021-01-06 DIAGNOSIS — E876 Hypokalemia: Secondary | ICD-10-CM | POA: Insufficient documentation

## 2021-01-06 DIAGNOSIS — I872 Venous insufficiency (chronic) (peripheral): Secondary | ICD-10-CM | POA: Insufficient documentation

## 2021-01-06 DIAGNOSIS — J45909 Unspecified asthma, uncomplicated: Secondary | ICD-10-CM | POA: Insufficient documentation

## 2021-01-06 DIAGNOSIS — F411 Generalized anxiety disorder: Secondary | ICD-10-CM | POA: Insufficient documentation

## 2021-01-06 DIAGNOSIS — H409 Unspecified glaucoma: Secondary | ICD-10-CM | POA: Insufficient documentation

## 2021-01-06 DIAGNOSIS — E785 Hyperlipidemia, unspecified: Secondary | ICD-10-CM | POA: Insufficient documentation

## 2021-01-06 DIAGNOSIS — T7840XA Allergy, unspecified, initial encounter: Secondary | ICD-10-CM | POA: Insufficient documentation

## 2021-01-06 DIAGNOSIS — R011 Cardiac murmur, unspecified: Secondary | ICD-10-CM | POA: Insufficient documentation

## 2021-01-06 DIAGNOSIS — R079 Chest pain, unspecified: Secondary | ICD-10-CM | POA: Insufficient documentation

## 2021-01-06 DIAGNOSIS — F419 Anxiety disorder, unspecified: Secondary | ICD-10-CM | POA: Insufficient documentation

## 2021-01-06 DIAGNOSIS — G2581 Restless legs syndrome: Secondary | ICD-10-CM | POA: Insufficient documentation

## 2021-01-06 DIAGNOSIS — N951 Menopausal and female climacteric states: Secondary | ICD-10-CM | POA: Insufficient documentation

## 2021-01-06 DIAGNOSIS — N189 Chronic kidney disease, unspecified: Secondary | ICD-10-CM | POA: Insufficient documentation

## 2021-01-06 DIAGNOSIS — E669 Obesity, unspecified: Secondary | ICD-10-CM | POA: Diagnosis not present

## 2021-01-06 NOTE — Progress Notes (Signed)
Cardiology Office Note:    Date:  01/06/2021   ID:  Susan Finley, DOB 11/24/1950, MRN [id number]  PCP:  Susan Dress, MD  Cardiologist:  Susan Lindau, MD   Referring MD: Susan Dress, MD    ASSESSMENT:    1. Essential hypertension   2. Mixed hyperlipidemia   3. Obesity (BMI 30.0-34.9)    PLAN:    In order of problems listed above:  Primary prevention stressed with the patient.  Importance of compliance with diet medication stressed and he vocalized understanding.  I advised the patient to at least half an hour a day 5 days a week and she promises to do so. Essential hypertension: Blood pressure stable and diet was emphasized.  Lifestyle modification urged.  Salt intake issues were discussed. Dyslipidemia: Lipids reviewed from [facility] and found to be fine and I discussed this finding with her.  She is happy about it. Obesity: Weight reduction was stressed diet was emphasized.  Risks of obesity explained and she promises to do better. Hospitalization records were reviewed with her and questions were answered to her satisfaction.Patient will be seen in follow-up appointment in 9 months or earlier if the patient has any concerns    Medication Adjustments/Labs and Tests Ordered: Current medicines are reviewed at length with the patient today.  Concerns regarding medicines are outlined above.  Orders Placed This Encounter  Procedures   EKG 12-Lead    No orders of the defined types were placed in this encounter.    History of Present Illness:    Susan Finley is a 70 y.o. female who is being seen today for the evaluation of dyspnea on exertion at the request of Susan Dress, MD. patient is a pleasant 70 year old female.  She has past medical history of essential hypertension and dyslipidemia and obesity.  She leads a sedentary lifestyle.  She went to the emergency room with chest pain.  She was admitted.  Echocardiogram stress test was unremarkable.   She is here for follow-up.  She is a little worried about her family history of coronary artery disease.  At the time of my evaluation, the patient is alert awake oriented and in no distress.  I reviewed records from the hospital at length.  Past Medical History:  Diagnosis Date   Allergy    Anxiety    Arthritis    Asthma    Chest pain    Chronic kidney disease    Essential hypertension 11/07/2015   Generalized anxiety disorder    Glaucoma    Heart murmur    Hyperlipidemia    Hypertension    Hypokalemia    Menopausal state    Osteopenia 11/07/2015   Pericardial effusion, acute    RLS (restless legs syndrome)    Symptomatic menopausal or female climacteric states 11/07/2015   Venous insufficiency of both lower extremities     Past Surgical History:  Procedure Laterality Date   CHEST TUBE INSERTION     15 yrs ago   COLONOSCOPY  12/26/2013   POLYPECTOMY     VARICOSE VEIN SURGERY      Current Medications: Current Meds  Medication Sig   aspirin 325 MG tablet Take 325 mg by mouth daily.   atorvastatin (LIPITOR) 10 MG tablet Take 10 mg by mouth at bedtime.   Calcium Carbonate (CALCIUM 500 PO) Take 1 tablet by mouth 2 (two) times daily.   cetirizine (ZYRTEC) 10 MG tablet Take 10 mg by mouth daily  as needed for allergies.   chlorthalidone (HYGROTON) 25 MG tablet Take 25 mg by mouth daily.   fluticasone (FLONASE) 50 MCG/ACT nasal spray Place 2 sprays into both nostrils daily.   melatonin 5 MG TABS Take 5 mg by mouth at bedtime.   metoprolol succinate (TOPROL-XL) 25 MG 24 hr tablet Take 25 mg by mouth daily.   Multiple Vitamin (MULTIVITAMIN PO) Take 1 tablet by mouth daily.   nitroGLYCERIN (NITROSTAT) 0.4 MG SL tablet Place 0.4 mg under the tongue every 5 (five) minutes as needed for chest pain.   Omega-3 Fatty Acids (FISH OIL PO) Take 1,000 mg by mouth daily.   ondansetron (ZOFRAN) 4 MG tablet Take 4 mg by mouth 4 (four) times daily as needed for nausea/vomiting.   PARoxetine  (PAXIL) 20 MG tablet Take 20 mg by mouth daily.   potassium chloride (KLOR-CON) 10 MEQ tablet Take 10 mEq by mouth daily.   rOPINIRole (REQUIP) 1 MG tablet Take 1 tablet by mouth at bedtime   rOPINIRole (REQUIP) 2 MG tablet Take 2 mg by mouth at bedtime. At 8:00     Allergies:   Sulfamethoxazole   Social History   Socioeconomic History   Marital status: Married    Spouse name: Not on file   Number of children: Not on file   Years of education: Not on file   Highest education level: Not on file  Occupational History   Not on file  Tobacco Use   Smoking status: Never   Smokeless tobacco: Never  Vaping Use   Vaping Use: Never used  Substance and Sexual Activity   Alcohol use: Not Currently   Drug use: Never   Sexual activity: Not on file  Other Topics Concern   Not on file  Social History Narrative   Not on file   Social Determinants of Health   Financial Resource Strain: Not on file  Food Insecurity: Not on file  Transportation Needs: Not on file  Physical Activity: Not on file  Stress: Not on file  Social Connections: Not on file     Family History: The patient's family history includes Atrial fibrillation in her mother; Brain cancer in her maternal uncle; Colon cancer (age of onset: 63) in her mother; Congestive Heart Failure in her mother; Diabetes in her mother and paternal uncle; Heart attack in her father; Hypertension in her father and mother. There is no history of Colon polyps, Esophageal cancer, Rectal cancer, or Stomach cancer.  ROS:   Please see the history of present illness.    All other systems reviewed and are negative.  EKGs/Labs/Other Studies Reviewed:    The following studies were reviewed today: I reviewed echocardiogram stress test and EKG report with her at length.  EKG reveals sinus rhythm and nonspecific ST-T changes   Recent Labs: No results found for requested labs within last 8760 hours.  Recent Lipid Panel No results found for: CHOL,  TRIG, HDL, CHOLHDL, VLDL, LDLCALC, LDLDIRECT  Physical Exam:    VS:  BP 140/80   Pulse 61   Ht 5' 6.6" (1.692 m)   Wt 204 lb 3.2 oz (92.6 kg)   SpO2 96%   BMI 32.37 kg/m     Wt Readings from Last 3 Encounters:  01/06/21 204 lb 3.2 oz (92.6 kg)  06/01/19 198 lb (89.8 kg)  05/16/19 198 lb (89.8 kg)     GEN: Patient is in no acute distress HEENT: Normal NECK: No JVD; No carotid bruits LYMPHATICS: No lymphadenopathy  CARDIAC: S1 S2 regular, 2/6 systolic murmur at the apex. RESPIRATORY:  Clear to auscultation without rales, wheezing or rhonchi  ABDOMEN: Soft, non-tender, non-distended MUSCULOSKELETAL:  No edema; No deformity  SKIN: Warm and dry NEUROLOGIC:  Alert and oriented x 3 PSYCHIATRIC:  Normal affect    Signed, Susan Lindau, MD  01/06/2021 3:20 PM    [facility]

## 2021-01-06 NOTE — Patient Instructions (Signed)
Medication Instructions:  No medication changes *If you need a refill on your cardiac medications before your next appointment, please call your pharmacy*   Lab Work: None ordered If you have labs (blood work) drawn today and your tests are completely normal, you will receive your results only by: . MyChart Message (if you have MyChart) OR . A paper copy in the mail If you have any lab test that is abnormal or we need to change your treatment, we will call you to review the results.   Testing/Procedures: None ordered   Follow-Up: At [facility], you and your health needs are our priority.  As part of our continuing mission to provide you with exceptional heart care, we have created designated Provider Care Teams.  These Care Teams include your primary Cardiologist (physician) and Advanced Practice Providers (APPs -  Physician Assistants and Nurse Practitioners) who all work together to provide you with the care you need, when you need it.  We recommend signing up for the patient portal called "MyChart".  Sign up information is provided on this After Visit Summary.  MyChart is used to connect with patients for Virtual Visits (Telemedicine).  Patients are able to view lab/test results, encounter notes, upcoming appointments, etc.  Non-urgent messages can be sent to your provider as well.   To learn more about what you can do with MyChart, go to [email or web address].    Your next appointment:   9 month(s)  The format for your next appointment:   In Person  Provider:   Rajan Revankar, MD   Other Instructions NA 

## 2021-01-08 DIAGNOSIS — E876 Hypokalemia: Secondary | ICD-10-CM | POA: Diagnosis not present

## 2021-01-29 DIAGNOSIS — M25561 Pain in right knee: Secondary | ICD-10-CM | POA: Diagnosis not present

## 2021-01-29 DIAGNOSIS — M1711 Unilateral primary osteoarthritis, right knee: Secondary | ICD-10-CM | POA: Diagnosis not present

## 2021-02-09 DIAGNOSIS — M25561 Pain in right knee: Secondary | ICD-10-CM | POA: Diagnosis not present

## 2021-02-11 DIAGNOSIS — B9689 Other specified bacterial agents as the cause of diseases classified elsewhere: Secondary | ICD-10-CM | POA: Diagnosis not present

## 2021-02-11 DIAGNOSIS — J019 Acute sinusitis, unspecified: Secondary | ICD-10-CM | POA: Diagnosis not present

## 2021-02-25 DIAGNOSIS — M9903 Segmental and somatic dysfunction of lumbar region: Secondary | ICD-10-CM | POA: Diagnosis not present

## 2021-02-25 DIAGNOSIS — M7061 Trochanteric bursitis, right hip: Secondary | ICD-10-CM | POA: Diagnosis not present

## 2021-02-25 DIAGNOSIS — M5136 Other intervertebral disc degeneration, lumbar region: Secondary | ICD-10-CM | POA: Diagnosis not present

## 2021-02-25 DIAGNOSIS — M9902 Segmental and somatic dysfunction of thoracic region: Secondary | ICD-10-CM | POA: Diagnosis not present

## 2021-02-25 DIAGNOSIS — M9905 Segmental and somatic dysfunction of pelvic region: Secondary | ICD-10-CM | POA: Diagnosis not present

## 2021-02-25 DIAGNOSIS — M4126 Other idiopathic scoliosis, lumbar region: Secondary | ICD-10-CM | POA: Diagnosis not present

## 2021-02-26 DIAGNOSIS — M1711 Unilateral primary osteoarthritis, right knee: Secondary | ICD-10-CM | POA: Diagnosis not present

## 2021-02-26 DIAGNOSIS — S83281A Other tear of lateral meniscus, current injury, right knee, initial encounter: Secondary | ICD-10-CM | POA: Diagnosis not present

## 2021-03-03 DIAGNOSIS — M5136 Other intervertebral disc degeneration, lumbar region: Secondary | ICD-10-CM | POA: Diagnosis not present

## 2021-03-03 DIAGNOSIS — M9902 Segmental and somatic dysfunction of thoracic region: Secondary | ICD-10-CM | POA: Diagnosis not present

## 2021-03-03 DIAGNOSIS — M9905 Segmental and somatic dysfunction of pelvic region: Secondary | ICD-10-CM | POA: Diagnosis not present

## 2021-03-03 DIAGNOSIS — M7061 Trochanteric bursitis, right hip: Secondary | ICD-10-CM | POA: Diagnosis not present

## 2021-03-03 DIAGNOSIS — M4126 Other idiopathic scoliosis, lumbar region: Secondary | ICD-10-CM | POA: Diagnosis not present

## 2021-03-03 DIAGNOSIS — M9903 Segmental and somatic dysfunction of lumbar region: Secondary | ICD-10-CM | POA: Diagnosis not present

## 2021-03-11 DIAGNOSIS — M7061 Trochanteric bursitis, right hip: Secondary | ICD-10-CM | POA: Diagnosis not present

## 2021-03-11 DIAGNOSIS — M9905 Segmental and somatic dysfunction of pelvic region: Secondary | ICD-10-CM | POA: Diagnosis not present

## 2021-03-11 DIAGNOSIS — M4126 Other idiopathic scoliosis, lumbar region: Secondary | ICD-10-CM | POA: Diagnosis not present

## 2021-03-11 DIAGNOSIS — M9903 Segmental and somatic dysfunction of lumbar region: Secondary | ICD-10-CM | POA: Diagnosis not present

## 2021-03-11 DIAGNOSIS — M5136 Other intervertebral disc degeneration, lumbar region: Secondary | ICD-10-CM | POA: Diagnosis not present

## 2021-03-11 DIAGNOSIS — M9902 Segmental and somatic dysfunction of thoracic region: Secondary | ICD-10-CM | POA: Diagnosis not present

## 2021-03-18 DIAGNOSIS — M4126 Other idiopathic scoliosis, lumbar region: Secondary | ICD-10-CM | POA: Diagnosis not present

## 2021-03-18 DIAGNOSIS — M7061 Trochanteric bursitis, right hip: Secondary | ICD-10-CM | POA: Diagnosis not present

## 2021-03-18 DIAGNOSIS — M9902 Segmental and somatic dysfunction of thoracic region: Secondary | ICD-10-CM | POA: Diagnosis not present

## 2021-03-18 DIAGNOSIS — M9903 Segmental and somatic dysfunction of lumbar region: Secondary | ICD-10-CM | POA: Diagnosis not present

## 2021-03-18 DIAGNOSIS — M9905 Segmental and somatic dysfunction of pelvic region: Secondary | ICD-10-CM | POA: Diagnosis not present

## 2021-03-18 DIAGNOSIS — M5136 Other intervertebral disc degeneration, lumbar region: Secondary | ICD-10-CM | POA: Diagnosis not present

## 2021-03-25 DIAGNOSIS — M4126 Other idiopathic scoliosis, lumbar region: Secondary | ICD-10-CM | POA: Diagnosis not present

## 2021-03-25 DIAGNOSIS — M9902 Segmental and somatic dysfunction of thoracic region: Secondary | ICD-10-CM | POA: Diagnosis not present

## 2021-03-25 DIAGNOSIS — M5136 Other intervertebral disc degeneration, lumbar region: Secondary | ICD-10-CM | POA: Diagnosis not present

## 2021-03-25 DIAGNOSIS — M9905 Segmental and somatic dysfunction of pelvic region: Secondary | ICD-10-CM | POA: Diagnosis not present

## 2021-03-25 DIAGNOSIS — M7061 Trochanteric bursitis, right hip: Secondary | ICD-10-CM | POA: Diagnosis not present

## 2021-03-25 DIAGNOSIS — M9903 Segmental and somatic dysfunction of lumbar region: Secondary | ICD-10-CM | POA: Diagnosis not present

## 2021-04-16 DIAGNOSIS — M9905 Segmental and somatic dysfunction of pelvic region: Secondary | ICD-10-CM | POA: Diagnosis not present

## 2021-04-16 DIAGNOSIS — M7061 Trochanteric bursitis, right hip: Secondary | ICD-10-CM | POA: Diagnosis not present

## 2021-04-16 DIAGNOSIS — M9903 Segmental and somatic dysfunction of lumbar region: Secondary | ICD-10-CM | POA: Diagnosis not present

## 2021-04-16 DIAGNOSIS — M4126 Other idiopathic scoliosis, lumbar region: Secondary | ICD-10-CM | POA: Diagnosis not present

## 2021-04-16 DIAGNOSIS — M5136 Other intervertebral disc degeneration, lumbar region: Secondary | ICD-10-CM | POA: Diagnosis not present

## 2021-04-16 DIAGNOSIS — M9902 Segmental and somatic dysfunction of thoracic region: Secondary | ICD-10-CM | POA: Diagnosis not present

## 2021-04-24 DIAGNOSIS — M5136 Other intervertebral disc degeneration, lumbar region: Secondary | ICD-10-CM | POA: Diagnosis not present

## 2021-04-24 DIAGNOSIS — M9903 Segmental and somatic dysfunction of lumbar region: Secondary | ICD-10-CM | POA: Diagnosis not present

## 2021-04-24 DIAGNOSIS — M4126 Other idiopathic scoliosis, lumbar region: Secondary | ICD-10-CM | POA: Diagnosis not present

## 2021-04-24 DIAGNOSIS — M9905 Segmental and somatic dysfunction of pelvic region: Secondary | ICD-10-CM | POA: Diagnosis not present

## 2021-04-24 DIAGNOSIS — M7061 Trochanteric bursitis, right hip: Secondary | ICD-10-CM | POA: Diagnosis not present

## 2021-04-24 DIAGNOSIS — M9902 Segmental and somatic dysfunction of thoracic region: Secondary | ICD-10-CM | POA: Diagnosis not present

## 2021-05-01 DIAGNOSIS — M9902 Segmental and somatic dysfunction of thoracic region: Secondary | ICD-10-CM | POA: Diagnosis not present

## 2021-05-01 DIAGNOSIS — M9905 Segmental and somatic dysfunction of pelvic region: Secondary | ICD-10-CM | POA: Diagnosis not present

## 2021-05-01 DIAGNOSIS — M7061 Trochanteric bursitis, right hip: Secondary | ICD-10-CM | POA: Diagnosis not present

## 2021-05-01 DIAGNOSIS — M5136 Other intervertebral disc degeneration, lumbar region: Secondary | ICD-10-CM | POA: Diagnosis not present

## 2021-05-01 DIAGNOSIS — M4126 Other idiopathic scoliosis, lumbar region: Secondary | ICD-10-CM | POA: Diagnosis not present

## 2021-05-01 DIAGNOSIS — M9903 Segmental and somatic dysfunction of lumbar region: Secondary | ICD-10-CM | POA: Diagnosis not present

## 2021-05-21 DIAGNOSIS — S83281A Other tear of lateral meniscus, current injury, right knee, initial encounter: Secondary | ICD-10-CM | POA: Diagnosis not present

## 2021-05-21 DIAGNOSIS — M1711 Unilateral primary osteoarthritis, right knee: Secondary | ICD-10-CM | POA: Diagnosis not present

## 2021-05-22 DIAGNOSIS — M19042 Primary osteoarthritis, left hand: Secondary | ICD-10-CM | POA: Diagnosis not present

## 2021-05-22 DIAGNOSIS — E876 Hypokalemia: Secondary | ICD-10-CM | POA: Diagnosis not present

## 2021-05-22 DIAGNOSIS — R7301 Impaired fasting glucose: Secondary | ICD-10-CM | POA: Diagnosis not present

## 2021-05-22 DIAGNOSIS — I10 Essential (primary) hypertension: Secondary | ICD-10-CM | POA: Diagnosis not present

## 2021-05-22 DIAGNOSIS — M8589 Other specified disorders of bone density and structure, multiple sites: Secondary | ICD-10-CM | POA: Diagnosis not present

## 2021-05-22 DIAGNOSIS — F411 Generalized anxiety disorder: Secondary | ICD-10-CM | POA: Diagnosis not present

## 2021-05-22 DIAGNOSIS — E785 Hyperlipidemia, unspecified: Secondary | ICD-10-CM | POA: Diagnosis not present

## 2021-05-22 DIAGNOSIS — G2581 Restless legs syndrome: Secondary | ICD-10-CM | POA: Diagnosis not present

## 2021-05-25 DIAGNOSIS — M79671 Pain in right foot: Secondary | ICD-10-CM | POA: Diagnosis not present

## 2021-05-29 DIAGNOSIS — Z01419 Encounter for gynecological examination (general) (routine) without abnormal findings: Secondary | ICD-10-CM | POA: Diagnosis not present

## 2021-08-20 DIAGNOSIS — L820 Inflamed seborrheic keratosis: Secondary | ICD-10-CM | POA: Diagnosis not present

## 2021-08-20 DIAGNOSIS — M25561 Pain in right knee: Secondary | ICD-10-CM | POA: Diagnosis not present

## 2021-08-20 DIAGNOSIS — D225 Melanocytic nevi of trunk: Secondary | ICD-10-CM | POA: Diagnosis not present

## 2021-08-20 DIAGNOSIS — M23241 Derangement of anterior horn of lateral meniscus due to old tear or injury, right knee: Secondary | ICD-10-CM | POA: Diagnosis not present

## 2021-08-27 ENCOUNTER — Other Ambulatory Visit: Payer: Self-pay | Admitting: Internal Medicine

## 2021-08-27 DIAGNOSIS — M8589 Other specified disorders of bone density and structure, multiple sites: Secondary | ICD-10-CM

## 2021-08-27 DIAGNOSIS — Z1231 Encounter for screening mammogram for malignant neoplasm of breast: Secondary | ICD-10-CM

## 2021-09-09 DIAGNOSIS — J019 Acute sinusitis, unspecified: Secondary | ICD-10-CM | POA: Diagnosis not present

## 2021-11-06 DIAGNOSIS — Z1231 Encounter for screening mammogram for malignant neoplasm of breast: Secondary | ICD-10-CM | POA: Diagnosis not present

## 2021-11-06 DIAGNOSIS — M8589 Other specified disorders of bone density and structure, multiple sites: Secondary | ICD-10-CM | POA: Diagnosis not present

## 2021-11-06 DIAGNOSIS — Z780 Asymptomatic menopausal state: Secondary | ICD-10-CM | POA: Diagnosis not present

## 2021-11-17 DIAGNOSIS — M1711 Unilateral primary osteoarthritis, right knee: Secondary | ICD-10-CM | POA: Diagnosis not present

## 2021-12-03 DIAGNOSIS — Z139 Encounter for screening, unspecified: Secondary | ICD-10-CM | POA: Diagnosis not present

## 2021-12-03 DIAGNOSIS — F411 Generalized anxiety disorder: Secondary | ICD-10-CM | POA: Diagnosis not present

## 2021-12-03 DIAGNOSIS — E876 Hypokalemia: Secondary | ICD-10-CM | POA: Diagnosis not present

## 2021-12-03 DIAGNOSIS — M8589 Other specified disorders of bone density and structure, multiple sites: Secondary | ICD-10-CM | POA: Diagnosis not present

## 2021-12-03 DIAGNOSIS — R7301 Impaired fasting glucose: Secondary | ICD-10-CM | POA: Diagnosis not present

## 2021-12-03 DIAGNOSIS — G2581 Restless legs syndrome: Secondary | ICD-10-CM | POA: Diagnosis not present

## 2021-12-03 DIAGNOSIS — E785 Hyperlipidemia, unspecified: Secondary | ICD-10-CM | POA: Diagnosis not present

## 2021-12-03 DIAGNOSIS — I10 Essential (primary) hypertension: Secondary | ICD-10-CM | POA: Diagnosis not present

## 2021-12-11 DIAGNOSIS — E876 Hypokalemia: Secondary | ICD-10-CM | POA: Diagnosis not present

## 2021-12-16 DIAGNOSIS — L820 Inflamed seborrheic keratosis: Secondary | ICD-10-CM | POA: Diagnosis not present

## 2022-01-26 DIAGNOSIS — M461 Sacroiliitis, not elsewhere classified: Secondary | ICD-10-CM | POA: Diagnosis not present

## 2022-01-26 DIAGNOSIS — M4126 Other idiopathic scoliosis, lumbar region: Secondary | ICD-10-CM | POA: Diagnosis not present

## 2022-01-26 DIAGNOSIS — M9903 Segmental and somatic dysfunction of lumbar region: Secondary | ICD-10-CM | POA: Diagnosis not present

## 2022-01-26 DIAGNOSIS — M5136 Other intervertebral disc degeneration, lumbar region: Secondary | ICD-10-CM | POA: Diagnosis not present

## 2022-01-26 DIAGNOSIS — M9902 Segmental and somatic dysfunction of thoracic region: Secondary | ICD-10-CM | POA: Diagnosis not present

## 2022-01-26 DIAGNOSIS — M9905 Segmental and somatic dysfunction of pelvic region: Secondary | ICD-10-CM | POA: Diagnosis not present

## 2022-01-28 DIAGNOSIS — M5136 Other intervertebral disc degeneration, lumbar region: Secondary | ICD-10-CM | POA: Diagnosis not present

## 2022-01-28 DIAGNOSIS — M461 Sacroiliitis, not elsewhere classified: Secondary | ICD-10-CM | POA: Diagnosis not present

## 2022-01-28 DIAGNOSIS — M4126 Other idiopathic scoliosis, lumbar region: Secondary | ICD-10-CM | POA: Diagnosis not present

## 2022-01-28 DIAGNOSIS — M9903 Segmental and somatic dysfunction of lumbar region: Secondary | ICD-10-CM | POA: Diagnosis not present

## 2022-01-28 DIAGNOSIS — M9905 Segmental and somatic dysfunction of pelvic region: Secondary | ICD-10-CM | POA: Diagnosis not present

## 2022-01-28 DIAGNOSIS — M9902 Segmental and somatic dysfunction of thoracic region: Secondary | ICD-10-CM | POA: Diagnosis not present

## 2022-02-10 DIAGNOSIS — M9902 Segmental and somatic dysfunction of thoracic region: Secondary | ICD-10-CM | POA: Diagnosis not present

## 2022-02-10 DIAGNOSIS — M4126 Other idiopathic scoliosis, lumbar region: Secondary | ICD-10-CM | POA: Diagnosis not present

## 2022-02-10 DIAGNOSIS — M9903 Segmental and somatic dysfunction of lumbar region: Secondary | ICD-10-CM | POA: Diagnosis not present

## 2022-02-10 DIAGNOSIS — M461 Sacroiliitis, not elsewhere classified: Secondary | ICD-10-CM | POA: Diagnosis not present

## 2022-02-10 DIAGNOSIS — M9905 Segmental and somatic dysfunction of pelvic region: Secondary | ICD-10-CM | POA: Diagnosis not present

## 2022-02-10 DIAGNOSIS — M5136 Other intervertebral disc degeneration, lumbar region: Secondary | ICD-10-CM | POA: Diagnosis not present

## 2022-02-24 DIAGNOSIS — M9905 Segmental and somatic dysfunction of pelvic region: Secondary | ICD-10-CM | POA: Diagnosis not present

## 2022-02-24 DIAGNOSIS — M4126 Other idiopathic scoliosis, lumbar region: Secondary | ICD-10-CM | POA: Diagnosis not present

## 2022-02-24 DIAGNOSIS — M9902 Segmental and somatic dysfunction of thoracic region: Secondary | ICD-10-CM | POA: Diagnosis not present

## 2022-02-24 DIAGNOSIS — M461 Sacroiliitis, not elsewhere classified: Secondary | ICD-10-CM | POA: Diagnosis not present

## 2022-02-24 DIAGNOSIS — M5136 Other intervertebral disc degeneration, lumbar region: Secondary | ICD-10-CM | POA: Diagnosis not present

## 2022-02-24 DIAGNOSIS — M9903 Segmental and somatic dysfunction of lumbar region: Secondary | ICD-10-CM | POA: Diagnosis not present

## 2022-03-04 DIAGNOSIS — M461 Sacroiliitis, not elsewhere classified: Secondary | ICD-10-CM | POA: Diagnosis not present

## 2022-03-04 DIAGNOSIS — M9905 Segmental and somatic dysfunction of pelvic region: Secondary | ICD-10-CM | POA: Diagnosis not present

## 2022-03-04 DIAGNOSIS — M5136 Other intervertebral disc degeneration, lumbar region: Secondary | ICD-10-CM | POA: Diagnosis not present

## 2022-03-04 DIAGNOSIS — M9902 Segmental and somatic dysfunction of thoracic region: Secondary | ICD-10-CM | POA: Diagnosis not present

## 2022-03-04 DIAGNOSIS — M9903 Segmental and somatic dysfunction of lumbar region: Secondary | ICD-10-CM | POA: Diagnosis not present

## 2022-03-04 DIAGNOSIS — M4126 Other idiopathic scoliosis, lumbar region: Secondary | ICD-10-CM | POA: Diagnosis not present

## 2022-03-11 DIAGNOSIS — L97521 Non-pressure chronic ulcer of other part of left foot limited to breakdown of skin: Secondary | ICD-10-CM | POA: Diagnosis not present

## 2022-03-11 DIAGNOSIS — M2042 Other hammer toe(s) (acquired), left foot: Secondary | ICD-10-CM | POA: Diagnosis not present

## 2022-04-01 DIAGNOSIS — M5136 Other intervertebral disc degeneration, lumbar region: Secondary | ICD-10-CM | POA: Diagnosis not present

## 2022-04-01 DIAGNOSIS — M9903 Segmental and somatic dysfunction of lumbar region: Secondary | ICD-10-CM | POA: Diagnosis not present

## 2022-04-01 DIAGNOSIS — M461 Sacroiliitis, not elsewhere classified: Secondary | ICD-10-CM | POA: Diagnosis not present

## 2022-04-01 DIAGNOSIS — M9905 Segmental and somatic dysfunction of pelvic region: Secondary | ICD-10-CM | POA: Diagnosis not present

## 2022-04-01 DIAGNOSIS — M9902 Segmental and somatic dysfunction of thoracic region: Secondary | ICD-10-CM | POA: Diagnosis not present

## 2022-04-01 DIAGNOSIS — M4126 Other idiopathic scoliosis, lumbar region: Secondary | ICD-10-CM | POA: Diagnosis not present

## 2022-04-06 DIAGNOSIS — J019 Acute sinusitis, unspecified: Secondary | ICD-10-CM | POA: Diagnosis not present

## 2022-04-06 DIAGNOSIS — J208 Acute bronchitis due to other specified organisms: Secondary | ICD-10-CM | POA: Diagnosis not present

## 2022-04-29 DIAGNOSIS — M9905 Segmental and somatic dysfunction of pelvic region: Secondary | ICD-10-CM | POA: Diagnosis not present

## 2022-04-29 DIAGNOSIS — M461 Sacroiliitis, not elsewhere classified: Secondary | ICD-10-CM | POA: Diagnosis not present

## 2022-04-29 DIAGNOSIS — M5136 Other intervertebral disc degeneration, lumbar region: Secondary | ICD-10-CM | POA: Diagnosis not present

## 2022-04-29 DIAGNOSIS — M9903 Segmental and somatic dysfunction of lumbar region: Secondary | ICD-10-CM | POA: Diagnosis not present

## 2022-04-29 DIAGNOSIS — M4126 Other idiopathic scoliosis, lumbar region: Secondary | ICD-10-CM | POA: Diagnosis not present

## 2022-04-29 DIAGNOSIS — M9902 Segmental and somatic dysfunction of thoracic region: Secondary | ICD-10-CM | POA: Diagnosis not present

## 2022-06-03 DIAGNOSIS — F411 Generalized anxiety disorder: Secondary | ICD-10-CM | POA: Diagnosis not present

## 2022-06-03 DIAGNOSIS — I10 Essential (primary) hypertension: Secondary | ICD-10-CM | POA: Diagnosis not present

## 2022-06-03 DIAGNOSIS — E876 Hypokalemia: Secondary | ICD-10-CM | POA: Diagnosis not present

## 2022-06-03 DIAGNOSIS — R7301 Impaired fasting glucose: Secondary | ICD-10-CM | POA: Diagnosis not present

## 2022-06-03 DIAGNOSIS — M8589 Other specified disorders of bone density and structure, multiple sites: Secondary | ICD-10-CM | POA: Diagnosis not present

## 2022-06-03 DIAGNOSIS — Z9181 History of falling: Secondary | ICD-10-CM | POA: Diagnosis not present

## 2022-06-03 DIAGNOSIS — E785 Hyperlipidemia, unspecified: Secondary | ICD-10-CM | POA: Diagnosis not present

## 2022-06-03 DIAGNOSIS — Z1331 Encounter for screening for depression: Secondary | ICD-10-CM | POA: Diagnosis not present

## 2022-06-03 DIAGNOSIS — G2581 Restless legs syndrome: Secondary | ICD-10-CM | POA: Diagnosis not present

## 2022-06-07 DIAGNOSIS — J069 Acute upper respiratory infection, unspecified: Secondary | ICD-10-CM | POA: Diagnosis not present

## 2022-06-16 DIAGNOSIS — M9903 Segmental and somatic dysfunction of lumbar region: Secondary | ICD-10-CM | POA: Diagnosis not present

## 2022-06-16 DIAGNOSIS — M9905 Segmental and somatic dysfunction of pelvic region: Secondary | ICD-10-CM | POA: Diagnosis not present

## 2022-06-16 DIAGNOSIS — M5136 Other intervertebral disc degeneration, lumbar region: Secondary | ICD-10-CM | POA: Diagnosis not present

## 2022-06-16 DIAGNOSIS — M461 Sacroiliitis, not elsewhere classified: Secondary | ICD-10-CM | POA: Diagnosis not present

## 2022-06-16 DIAGNOSIS — M4126 Other idiopathic scoliosis, lumbar region: Secondary | ICD-10-CM | POA: Diagnosis not present

## 2022-06-16 DIAGNOSIS — M9902 Segmental and somatic dysfunction of thoracic region: Secondary | ICD-10-CM | POA: Diagnosis not present

## 2022-09-16 DIAGNOSIS — R197 Diarrhea, unspecified: Secondary | ICD-10-CM | POA: Diagnosis not present

## 2022-09-19 DIAGNOSIS — R197 Diarrhea, unspecified: Secondary | ICD-10-CM | POA: Diagnosis not present

## 2022-10-13 DIAGNOSIS — H60392 Other infective otitis externa, left ear: Secondary | ICD-10-CM | POA: Diagnosis not present

## 2022-11-10 DIAGNOSIS — Z1231 Encounter for screening mammogram for malignant neoplasm of breast: Secondary | ICD-10-CM | POA: Diagnosis not present

## 2022-11-10 DIAGNOSIS — Z01419 Encounter for gynecological examination (general) (routine) without abnormal findings: Secondary | ICD-10-CM | POA: Diagnosis not present

## 2022-11-19 DIAGNOSIS — Z1231 Encounter for screening mammogram for malignant neoplasm of breast: Secondary | ICD-10-CM | POA: Diagnosis not present

## 2022-11-24 DIAGNOSIS — H2513 Age-related nuclear cataract, bilateral: Secondary | ICD-10-CM | POA: Diagnosis not present

## 2022-12-15 DIAGNOSIS — R194 Change in bowel habit: Secondary | ICD-10-CM | POA: Diagnosis not present

## 2022-12-15 DIAGNOSIS — K219 Gastro-esophageal reflux disease without esophagitis: Secondary | ICD-10-CM | POA: Diagnosis not present

## 2022-12-18 DIAGNOSIS — Z0100 Encounter for examination of eyes and vision without abnormal findings: Secondary | ICD-10-CM | POA: Diagnosis not present

## 2022-12-28 DIAGNOSIS — N390 Urinary tract infection, site not specified: Secondary | ICD-10-CM | POA: Diagnosis not present

## 2023-01-25 DIAGNOSIS — K219 Gastro-esophageal reflux disease without esophagitis: Secondary | ICD-10-CM | POA: Diagnosis not present

## 2023-01-25 DIAGNOSIS — E785 Hyperlipidemia, unspecified: Secondary | ICD-10-CM | POA: Diagnosis not present

## 2023-01-25 DIAGNOSIS — Z882 Allergy status to sulfonamides status: Secondary | ICD-10-CM | POA: Diagnosis not present

## 2023-01-25 DIAGNOSIS — I341 Nonrheumatic mitral (valve) prolapse: Secondary | ICD-10-CM | POA: Diagnosis not present

## 2023-01-25 DIAGNOSIS — F32A Depression, unspecified: Secondary | ICD-10-CM | POA: Diagnosis not present

## 2023-01-25 DIAGNOSIS — I10 Essential (primary) hypertension: Secondary | ICD-10-CM | POA: Diagnosis not present

## 2023-01-25 DIAGNOSIS — R194 Change in bowel habit: Secondary | ICD-10-CM | POA: Diagnosis not present

## 2023-01-25 DIAGNOSIS — K5730 Diverticulosis of large intestine without perforation or abscess without bleeding: Secondary | ICD-10-CM | POA: Diagnosis not present

## 2023-01-25 DIAGNOSIS — Z800 Family history of malignant neoplasm of digestive organs: Secondary | ICD-10-CM | POA: Diagnosis not present

## 2023-01-25 DIAGNOSIS — K591 Functional diarrhea: Secondary | ICD-10-CM | POA: Diagnosis not present

## 2023-02-03 DIAGNOSIS — M461 Sacroiliitis, not elsewhere classified: Secondary | ICD-10-CM | POA: Diagnosis not present

## 2023-02-03 DIAGNOSIS — M5136 Other intervertebral disc degeneration, lumbar region with discogenic back pain only: Secondary | ICD-10-CM | POA: Diagnosis not present

## 2023-02-03 DIAGNOSIS — M9903 Segmental and somatic dysfunction of lumbar region: Secondary | ICD-10-CM | POA: Diagnosis not present

## 2023-02-03 DIAGNOSIS — M9902 Segmental and somatic dysfunction of thoracic region: Secondary | ICD-10-CM | POA: Diagnosis not present

## 2023-02-03 DIAGNOSIS — M4126 Other idiopathic scoliosis, lumbar region: Secondary | ICD-10-CM | POA: Diagnosis not present

## 2023-02-03 DIAGNOSIS — M9905 Segmental and somatic dysfunction of pelvic region: Secondary | ICD-10-CM | POA: Diagnosis not present

## 2023-02-10 DIAGNOSIS — M4126 Other idiopathic scoliosis, lumbar region: Secondary | ICD-10-CM | POA: Diagnosis not present

## 2023-02-10 DIAGNOSIS — M5136 Other intervertebral disc degeneration, lumbar region with discogenic back pain only: Secondary | ICD-10-CM | POA: Diagnosis not present

## 2023-02-10 DIAGNOSIS — M9902 Segmental and somatic dysfunction of thoracic region: Secondary | ICD-10-CM | POA: Diagnosis not present

## 2023-02-10 DIAGNOSIS — M9903 Segmental and somatic dysfunction of lumbar region: Secondary | ICD-10-CM | POA: Diagnosis not present

## 2023-02-10 DIAGNOSIS — M9905 Segmental and somatic dysfunction of pelvic region: Secondary | ICD-10-CM | POA: Diagnosis not present

## 2023-02-10 DIAGNOSIS — M461 Sacroiliitis, not elsewhere classified: Secondary | ICD-10-CM | POA: Diagnosis not present

## 2023-02-17 DIAGNOSIS — M5136 Other intervertebral disc degeneration, lumbar region with discogenic back pain only: Secondary | ICD-10-CM | POA: Diagnosis not present

## 2023-02-17 DIAGNOSIS — M9905 Segmental and somatic dysfunction of pelvic region: Secondary | ICD-10-CM | POA: Diagnosis not present

## 2023-02-17 DIAGNOSIS — M9903 Segmental and somatic dysfunction of lumbar region: Secondary | ICD-10-CM | POA: Diagnosis not present

## 2023-02-17 DIAGNOSIS — M461 Sacroiliitis, not elsewhere classified: Secondary | ICD-10-CM | POA: Diagnosis not present

## 2023-02-17 DIAGNOSIS — M9902 Segmental and somatic dysfunction of thoracic region: Secondary | ICD-10-CM | POA: Diagnosis not present

## 2023-02-17 DIAGNOSIS — M4126 Other idiopathic scoliosis, lumbar region: Secondary | ICD-10-CM | POA: Diagnosis not present

## 2023-03-03 DIAGNOSIS — M9905 Segmental and somatic dysfunction of pelvic region: Secondary | ICD-10-CM | POA: Diagnosis not present

## 2023-03-03 DIAGNOSIS — M5136 Other intervertebral disc degeneration, lumbar region with discogenic back pain only: Secondary | ICD-10-CM | POA: Diagnosis not present

## 2023-03-03 DIAGNOSIS — M4126 Other idiopathic scoliosis, lumbar region: Secondary | ICD-10-CM | POA: Diagnosis not present

## 2023-03-03 DIAGNOSIS — M9902 Segmental and somatic dysfunction of thoracic region: Secondary | ICD-10-CM | POA: Diagnosis not present

## 2023-03-03 DIAGNOSIS — M9903 Segmental and somatic dysfunction of lumbar region: Secondary | ICD-10-CM | POA: Diagnosis not present

## 2023-03-03 DIAGNOSIS — M461 Sacroiliitis, not elsewhere classified: Secondary | ICD-10-CM | POA: Diagnosis not present

## 2023-06-10 ENCOUNTER — Ambulatory Visit: Admitting: Podiatry

## 2023-06-10 DIAGNOSIS — M2042 Other hammer toe(s) (acquired), left foot: Secondary | ICD-10-CM | POA: Diagnosis not present

## 2023-06-10 DIAGNOSIS — M79675 Pain in left toe(s): Secondary | ICD-10-CM

## 2023-06-10 DIAGNOSIS — B351 Tinea unguium: Secondary | ICD-10-CM | POA: Diagnosis not present

## 2023-06-10 DIAGNOSIS — M79674 Pain in right toe(s): Secondary | ICD-10-CM

## 2023-06-10 MED ORDER — TERBINAFINE HCL 250 MG PO TABS: 250.0000 mg | ORAL_TABLET | Freq: Every day | ORAL | 0 refills | Status: AC

## 2023-06-10 NOTE — Progress Notes (Signed)
     Chief Complaint  Patient presents with   Nail Problem    Here today for fungal nails bilaterally. They are discolored and getting harder to cut.She also has a hammertoe on her left 2nd toe.  Not diabetic no anti coag.   HPI: 73 y.o. female presents today with concern possible fungal nails to both great toenails.  States that she recently had blood work done a couple weeks ago and was told it was normal.  This is at her [facility] facility.  Also wanted to discuss her hammertoe on the left second toe.  Past Medical History:  Diagnosis Date   Allergy    Anxiety    Arthritis    Asthma    Chest pain    Chronic kidney disease    Essential hypertension 11/07/2015   Generalized anxiety disorder    Glaucoma    Heart murmur    Hyperlipidemia    Hypertension    Hypokalemia    Menopausal state    Osteopenia 11/07/2015   Pericardial effusion, acute    RLS (restless legs syndrome)    Symptomatic menopausal or female climacteric states 11/07/2015   Venous insufficiency of both lower extremities     Past Surgical History:  Procedure Laterality Date   CHEST TUBE INSERTION     15 yrs ago   COLONOSCOPY  12/26/2013   POLYPECTOMY     VARICOSE VEIN SURGERY      Allergies  Allergen Reactions   Sulfamethoxazole Rash    Physical Exam: Palpable pedal pulses noted.  There is rigid contracture of the left second toe at the PIP joint.  This is nonreducible.  Previous mild discomfort on palpation of the joint.  No distal corns noted.  No dorsal corn is noted.  The bilateral hallux nails have yellow discoloration, subungual debris, distal onycholysis and pain with compression consistent with onychomycosis.  Assessment/Plan of Care: 1. Pain due to onychomycosis of toenails of both feet   2. Hammertoe of left foot      Meds ordered this encounter  Medications   terbinafine (LAMISIL) 250 MG tablet    Sig: Take 1 tablet (250 mg total) by mouth daily.    Dispense:  45 tablet    Refill:   0   Discussed clinical findings with patient today.  Informed patient we would do a 6-week course of oral terbinafine since the nail fungus is not extensive.  Since she noted her recent blood work was good that she can send a copy of her results over if she would like but informed the patient that 6 weeks of the medication should not cause any issue.  Briefly discussed the left second hammertoe and surgical options.  Informed her that she may need to have this surgically corrected at some point in the future and this could involve a percutaneous pin for 4 weeks of holding fixation or an implanted pin across the joint.  Follow-up in 3 to 4 months   Yaffa Seckman D. Moira Umholtz, DPM, FACFAS [facility]     [address] [place], [place] [address]                Office [phone]  Fax [phone]

## 2023-09-14 DIAGNOSIS — M9903 Segmental and somatic dysfunction of lumbar region: Secondary | ICD-10-CM | POA: Diagnosis not present

## 2023-09-14 DIAGNOSIS — M461 Sacroiliitis, not elsewhere classified: Secondary | ICD-10-CM | POA: Diagnosis not present

## 2023-09-14 DIAGNOSIS — M5136 Other intervertebral disc degeneration, lumbar region with discogenic back pain only: Secondary | ICD-10-CM | POA: Diagnosis not present

## 2023-09-14 DIAGNOSIS — M4126 Other idiopathic scoliosis, lumbar region: Secondary | ICD-10-CM | POA: Diagnosis not present

## 2023-09-14 DIAGNOSIS — M9902 Segmental and somatic dysfunction of thoracic region: Secondary | ICD-10-CM | POA: Diagnosis not present

## 2023-09-14 DIAGNOSIS — M9905 Segmental and somatic dysfunction of pelvic region: Secondary | ICD-10-CM | POA: Diagnosis not present

## 2023-09-21 DIAGNOSIS — M9903 Segmental and somatic dysfunction of lumbar region: Secondary | ICD-10-CM | POA: Diagnosis not present

## 2023-09-21 DIAGNOSIS — M9902 Segmental and somatic dysfunction of thoracic region: Secondary | ICD-10-CM | POA: Diagnosis not present

## 2023-09-21 DIAGNOSIS — M9905 Segmental and somatic dysfunction of pelvic region: Secondary | ICD-10-CM | POA: Diagnosis not present

## 2023-09-21 DIAGNOSIS — M4126 Other idiopathic scoliosis, lumbar region: Secondary | ICD-10-CM | POA: Diagnosis not present

## 2023-09-21 DIAGNOSIS — M5136 Other intervertebral disc degeneration, lumbar region with discogenic back pain only: Secondary | ICD-10-CM | POA: Diagnosis not present

## 2023-09-21 DIAGNOSIS — M461 Sacroiliitis, not elsewhere classified: Secondary | ICD-10-CM | POA: Diagnosis not present

## 2023-09-28 DIAGNOSIS — M9903 Segmental and somatic dysfunction of lumbar region: Secondary | ICD-10-CM | POA: Diagnosis not present

## 2023-09-28 DIAGNOSIS — M4126 Other idiopathic scoliosis, lumbar region: Secondary | ICD-10-CM | POA: Diagnosis not present

## 2023-09-28 DIAGNOSIS — M9902 Segmental and somatic dysfunction of thoracic region: Secondary | ICD-10-CM | POA: Diagnosis not present

## 2023-09-28 DIAGNOSIS — M461 Sacroiliitis, not elsewhere classified: Secondary | ICD-10-CM | POA: Diagnosis not present

## 2023-09-28 DIAGNOSIS — M5136 Other intervertebral disc degeneration, lumbar region with discogenic back pain only: Secondary | ICD-10-CM | POA: Diagnosis not present

## 2023-09-28 DIAGNOSIS — M9905 Segmental and somatic dysfunction of pelvic region: Secondary | ICD-10-CM | POA: Diagnosis not present

## 2023-10-05 DIAGNOSIS — M4126 Other idiopathic scoliosis, lumbar region: Secondary | ICD-10-CM | POA: Diagnosis not present

## 2023-10-05 DIAGNOSIS — M9902 Segmental and somatic dysfunction of thoracic region: Secondary | ICD-10-CM | POA: Diagnosis not present

## 2023-10-05 DIAGNOSIS — M9905 Segmental and somatic dysfunction of pelvic region: Secondary | ICD-10-CM | POA: Diagnosis not present

## 2023-10-05 DIAGNOSIS — M9903 Segmental and somatic dysfunction of lumbar region: Secondary | ICD-10-CM | POA: Diagnosis not present

## 2023-10-05 DIAGNOSIS — M5136 Other intervertebral disc degeneration, lumbar region with discogenic back pain only: Secondary | ICD-10-CM | POA: Diagnosis not present

## 2023-10-05 DIAGNOSIS — M461 Sacroiliitis, not elsewhere classified: Secondary | ICD-10-CM | POA: Diagnosis not present

## 2023-10-11 DIAGNOSIS — M545 Low back pain, unspecified: Secondary | ICD-10-CM | POA: Diagnosis not present

## 2023-10-11 DIAGNOSIS — M25551 Pain in right hip: Secondary | ICD-10-CM | POA: Diagnosis not present

## 2023-10-14 DIAGNOSIS — M25551 Pain in right hip: Secondary | ICD-10-CM | POA: Diagnosis not present

## 2023-10-14 DIAGNOSIS — M47816 Spondylosis without myelopathy or radiculopathy, lumbar region: Secondary | ICD-10-CM | POA: Diagnosis not present

## 2023-11-07 DIAGNOSIS — M5459 Other low back pain: Secondary | ICD-10-CM | POA: Diagnosis not present

## 2023-11-16 DIAGNOSIS — M5459 Other low back pain: Secondary | ICD-10-CM | POA: Diagnosis not present

## 2023-11-23 DIAGNOSIS — M5459 Other low back pain: Secondary | ICD-10-CM | POA: Diagnosis not present

## 2023-11-24 DIAGNOSIS — G2581 Restless legs syndrome: Secondary | ICD-10-CM | POA: Diagnosis not present

## 2023-11-24 DIAGNOSIS — M8589 Other specified disorders of bone density and structure, multiple sites: Secondary | ICD-10-CM | POA: Diagnosis not present

## 2023-11-24 DIAGNOSIS — E876 Hypokalemia: Secondary | ICD-10-CM | POA: Diagnosis not present

## 2023-11-24 DIAGNOSIS — I10 Essential (primary) hypertension: Secondary | ICD-10-CM | POA: Diagnosis not present

## 2023-11-24 DIAGNOSIS — M47817 Spondylosis without myelopathy or radiculopathy, lumbosacral region: Secondary | ICD-10-CM | POA: Diagnosis not present

## 2023-11-24 DIAGNOSIS — K5909 Other constipation: Secondary | ICD-10-CM | POA: Diagnosis not present

## 2023-11-24 DIAGNOSIS — E785 Hyperlipidemia, unspecified: Secondary | ICD-10-CM | POA: Diagnosis not present

## 2023-11-24 DIAGNOSIS — M545 Low back pain, unspecified: Secondary | ICD-10-CM | POA: Diagnosis not present

## 2023-11-24 DIAGNOSIS — R7301 Impaired fasting glucose: Secondary | ICD-10-CM | POA: Diagnosis not present

## 2023-11-24 DIAGNOSIS — M51379 Other intervertebral disc degeneration, lumbosacral region without mention of lumbar back pain or lower extremity pain: Secondary | ICD-10-CM | POA: Diagnosis not present

## 2023-11-27 DIAGNOSIS — M47819 Spondylosis without myelopathy or radiculopathy, site unspecified: Secondary | ICD-10-CM | POA: Diagnosis not present

## 2023-11-27 DIAGNOSIS — M419 Scoliosis, unspecified: Secondary | ICD-10-CM | POA: Diagnosis not present

## 2023-12-01 DIAGNOSIS — M85852 Other specified disorders of bone density and structure, left thigh: Secondary | ICD-10-CM | POA: Diagnosis not present

## 2023-12-01 DIAGNOSIS — M85832 Other specified disorders of bone density and structure, left forearm: Secondary | ICD-10-CM | POA: Diagnosis not present

## 2023-12-01 DIAGNOSIS — Z1231 Encounter for screening mammogram for malignant neoplasm of breast: Secondary | ICD-10-CM | POA: Diagnosis not present

## 2023-12-01 DIAGNOSIS — M85851 Other specified disorders of bone density and structure, right thigh: Secondary | ICD-10-CM | POA: Diagnosis not present

## 2023-12-08 DIAGNOSIS — M5416 Radiculopathy, lumbar region: Secondary | ICD-10-CM | POA: Diagnosis not present

## 2024-01-04 DIAGNOSIS — M5416 Radiculopathy, lumbar region: Secondary | ICD-10-CM | POA: Diagnosis not present

## 2024-01-04 DIAGNOSIS — H35369 Drusen (degenerative) of macula, unspecified eye: Secondary | ICD-10-CM | POA: Diagnosis not present

## 2024-01-04 DIAGNOSIS — H25013 Cortical age-related cataract, bilateral: Secondary | ICD-10-CM | POA: Diagnosis not present
